# Patient Record
Sex: Female | Born: 1980
Health system: Southern US, Community
[De-identification: ages and names within clinical notes are randomized; demographics above are authoritative.]

## PROBLEM LIST (undated history)

## (undated) DIAGNOSIS — E079 Disorder of thyroid, unspecified: Secondary | ICD-10-CM

## (undated) DIAGNOSIS — E039 Hypothyroidism, unspecified: Secondary | ICD-10-CM

## (undated) DIAGNOSIS — O35BXX0 Maternal care for other (suspected) fetal abnormality and damage, fetal cardiac anomalies, not applicable or unspecified: Secondary | ICD-10-CM

## (undated) DIAGNOSIS — O99280 Endocrine, nutritional and metabolic diseases complicating pregnancy, unspecified trimester: Secondary | ICD-10-CM

## (undated) DIAGNOSIS — D219 Benign neoplasm of connective and other soft tissue, unspecified: Secondary | ICD-10-CM

## (undated) DIAGNOSIS — O358XX0 Maternal care for other (suspected) fetal abnormality and damage, not applicable or unspecified: Secondary | ICD-10-CM

## (undated) DIAGNOSIS — N979 Female infertility, unspecified: Secondary | ICD-10-CM

## (undated) DIAGNOSIS — O09529 Supervision of elderly multigravida, unspecified trimester: Secondary | ICD-10-CM

## (undated) DIAGNOSIS — R87629 Unspecified abnormal cytological findings in specimens from vagina: Secondary | ICD-10-CM

## (undated) DIAGNOSIS — O35EXX0 Maternal care for other (suspected) fetal abnormality and damage, fetal genitourinary anomalies, not applicable or unspecified: Secondary | ICD-10-CM

## (undated) DIAGNOSIS — E041 Nontoxic single thyroid nodule: Secondary | ICD-10-CM

## (undated) HISTORY — DX: Disorder of thyroid, unspecified: E07.9

## (undated) HISTORY — DX: Supervision of elderly multigravida, unspecified trimester: O09.529

## (undated) HISTORY — DX: Maternal care for other (suspected) fetal abnormality and damage, fetal genitourinary anomalies, not applicable or unspecified: O35.EXX0

## (undated) HISTORY — DX: Benign neoplasm of connective and other soft tissue, unspecified: D21.9

## (undated) HISTORY — DX: Endocrine, nutritional and metabolic diseases complicating pregnancy, unspecified trimester: O99.280

## (undated) HISTORY — DX: Nontoxic single thyroid nodule: E04.1

## (undated) HISTORY — DX: Maternal care for other (suspected) fetal abnormality and damage, fetal cardiac anomalies, not applicable or unspecified: O35.BXX0

## (undated) HISTORY — DX: Maternal care for other (suspected) fetal abnormality and damage, not applicable or unspecified: O35.8XX0

## (undated) HISTORY — PX: NO PAST SURGERIES: SHX2092

---

## 2006-09-21 ENCOUNTER — Ambulatory Visit: Payer: Self-pay | Admitting: Internal Medicine

## 2006-09-21 LAB — CONVERTED CEMR LAB
ALT: 18 units/L (ref 0–40)
AST: 18 units/L (ref 0–37)
Albumin: 4 g/dL (ref 3.5–5.2)
Alkaline Phosphatase: 39 units/L (ref 39–117)
BUN: 14 mg/dL (ref 6–23)
Chloride: 107 meq/L (ref 96–112)
Cholesterol: 169 mg/dL (ref 0–200)
Creatinine, Ser: 0.7 mg/dL (ref 0.4–1.2)
Free T4: 0.9 ng/dL (ref 0.9–1.8)
Glucose, Bld: 90 mg/dL (ref 70–99)
HCT: 38.7 % (ref 36.0–46.0)
Platelets: 249 10*3/uL (ref 150–400)
Potassium: 4 meq/L (ref 3.5–5.1)
T3, Free: 3.4 pg/mL (ref 2.3–4.2)
WBC: 9.9 10*3/uL (ref 4.5–10.5)

## 2006-10-01 ENCOUNTER — Encounter: Admission: RE | Admit: 2006-10-01 | Discharge: 2006-10-01 | Payer: Self-pay | Admitting: Internal Medicine

## 2006-12-02 ENCOUNTER — Other Ambulatory Visit: Admission: RE | Admit: 2006-12-02 | Discharge: 2006-12-02 | Payer: Self-pay | Admitting: Surgery

## 2007-03-31 ENCOUNTER — Encounter: Payer: Self-pay | Admitting: Internal Medicine

## 2008-01-11 ENCOUNTER — Telehealth (INDEPENDENT_AMBULATORY_CARE_PROVIDER_SITE_OTHER): Payer: Self-pay | Admitting: *Deleted

## 2008-01-11 ENCOUNTER — Ambulatory Visit: Payer: Self-pay | Admitting: Internal Medicine

## 2008-01-11 LAB — CONVERTED CEMR LAB
Ketones, urine, test strip: NEGATIVE
Specific Gravity, Urine: 1.01

## 2008-01-12 ENCOUNTER — Encounter: Payer: Self-pay | Admitting: Internal Medicine

## 2008-01-12 LAB — CONVERTED CEMR LAB

## 2008-01-13 ENCOUNTER — Encounter: Payer: Self-pay | Admitting: Internal Medicine

## 2008-01-27 ENCOUNTER — Encounter: Payer: Self-pay | Admitting: Internal Medicine

## 2009-01-28 ENCOUNTER — Inpatient Hospital Stay (HOSPITAL_COMMUNITY): Admission: RE | Admit: 2009-01-28 | Discharge: 2009-01-30 | Payer: Self-pay | Admitting: Obstetrics and Gynecology

## 2009-02-19 ENCOUNTER — Ambulatory Visit: Admission: RE | Admit: 2009-02-19 | Discharge: 2009-02-19 | Payer: Self-pay | Admitting: Obstetrics and Gynecology

## 2009-07-19 ENCOUNTER — Ambulatory Visit: Payer: Self-pay | Admitting: Internal Medicine

## 2009-07-19 DIAGNOSIS — E041 Nontoxic single thyroid nodule: Secondary | ICD-10-CM | POA: Insufficient documentation

## 2009-07-31 ENCOUNTER — Ambulatory Visit: Payer: Self-pay | Admitting: Internal Medicine

## 2009-08-02 ENCOUNTER — Encounter (INDEPENDENT_AMBULATORY_CARE_PROVIDER_SITE_OTHER): Payer: Self-pay | Admitting: *Deleted

## 2009-08-02 LAB — CONVERTED CEMR LAB
Basophils Absolute: 0 10*3/uL (ref 0.0–0.1)
CO2: 29 meq/L (ref 19–32)
Chloride: 109 meq/L (ref 96–112)
Cholesterol: 165 mg/dL (ref 0–200)
Glucose, Bld: 80 mg/dL (ref 70–99)
HCT: 40.9 % (ref 36.0–46.0)
HDL: 48 mg/dL (ref >39.00)
LDL Cholesterol: 108 mg/dL — ABNORMAL HIGH (ref 0–99)
Lymphocytes Relative: 45.6 % (ref 12.0–46.0)
MCHC: 34.7 g/dL (ref 30.0–36.0)
MCV: 87.8 fL (ref 78.0–100.0)
Monocytes Relative: 11.2 % (ref 3.0–12.0)
Neutro Abs: 3.1 10*3/uL (ref 1.4–7.7)
Neutrophils Relative %: 40.7 % — ABNORMAL LOW (ref 43.0–77.0)
Platelets: 207 10*3/uL (ref 150.0–400.0)
Potassium: 4 meq/L (ref 3.5–5.1)
RBC: 4.66 M/uL (ref 3.87–5.11)
Sodium: 141 meq/L (ref 135–145)
TSH: 1.58 microintl units/mL (ref 0.35–5.50)

## 2011-02-12 LAB — CBC
Hemoglobin: 11.9 g/dL — ABNORMAL LOW (ref 12.0–15.0)
MCHC: 33.9 g/dL (ref 30.0–36.0)
MCV: 86.1 fL (ref 78.0–100.0)
MCV: 86.1 fL (ref 78.0–100.0)
Platelets: 180 10*3/uL (ref 150–400)
RBC: 3.09 MIL/uL — ABNORMAL LOW (ref 3.87–5.11)
RBC: 4.1 MIL/uL (ref 3.87–5.11)
RDW: 13.9 % (ref 11.5–15.5)
WBC: 10.4 10*3/uL (ref 4.0–10.5)

## 2011-08-07 ENCOUNTER — Encounter: Payer: Self-pay | Admitting: Internal Medicine

## 2011-08-07 ENCOUNTER — Ambulatory Visit (INDEPENDENT_AMBULATORY_CARE_PROVIDER_SITE_OTHER): Payer: BC Managed Care – PPO | Admitting: Internal Medicine

## 2011-08-07 VITALS — BP 98/64 | HR 81 | Resp 14 | Wt 141.0 lb

## 2011-08-07 DIAGNOSIS — R5381 Other malaise: Secondary | ICD-10-CM

## 2011-08-07 DIAGNOSIS — E041 Nontoxic single thyroid nodule: Secondary | ICD-10-CM

## 2011-08-07 DIAGNOSIS — R5383 Other fatigue: Secondary | ICD-10-CM

## 2011-08-07 LAB — CBC WITH DIFFERENTIAL/PLATELET
Eosinophils Relative: 4 % (ref 0–5)
Hemoglobin: 13.3 g/dL (ref 12.0–15.0)
Lymphs Abs: 2.9 10*3/uL (ref 0.7–4.0)
MCHC: 34.7 g/dL (ref 30.0–36.0)
MCV: 86.8 fL (ref 78.0–100.0)
Neutro Abs: 4.4 10*3/uL (ref 1.7–7.7)
Neutrophils Relative %: 52 % (ref 43–77)
Platelets: 248 10*3/uL (ref 150–400)

## 2011-08-07 NOTE — Patient Instructions (Signed)
Call if no better in few weeks

## 2011-08-07 NOTE — Assessment & Plan Note (Addendum)
Used to see surgery, has not been seen in awhile Last u/s ~ 2 years ago Schedule a u/s

## 2011-08-07 NOTE — Assessment & Plan Note (Addendum)
Chief complaint today is fatigue, review of systems is positive for stress. Plan is to get labs, if labs are normal my opinion would be that fatigue is likely related to his stress. With that in mind, we had a long conversation about anxiety, I recommend to see what can be changed in her schedule to decrease stress, I also recommend to see a counselor if needed and of course at some point if she feels is necessary, will prescribe medication. She wonders about vitamins, I recommend a multivitamin, good nutrition, daily exercise (which she does) and drink plenty of fluids.

## 2011-08-07 NOTE — Progress Notes (Signed)
  Subjective:    Patient ID: Vanessa Ferguson, female    DOB: 01/14/1981, 30 y.o.   MRN: [id number]  HPI A few months history of lack of energy, fatigue. At times she feels shaky "like when you drink a lot of coffee". Denies feeling sleepy, is just lack of energy. Has a history of a thyroid nodule, has not seen her surgeon or have the ultrasound in about 3 years Is not taking any birth control pills.  Past Medical History  Diagnosis Date  . Thyroid nodule     R thyroid nodule,used to see Dr Gerrit Friends  , s/pFNA (-) aprox 2008   Past Surgical History  Procedure Date  . No past surgeries    History   Social History  . Marital Status: Married    Spouse Name: N/A    Number of Children: N/A  . Years of Education: N/A   Occupational History  . Not on file.   Social History Main Topics  . Smoking status: Never Smoker   . Smokeless tobacco: Never Used  . Alcohol Use: Yes     socially   . Drug Use: No  . Sexually Active: Not on file   Other Topics Concern  . Not on file   Social History Narrative  . 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 No  depression per se Denies snoring. Periods are normal, has gained 2 or 3 pounds in the last 6 months. Denies any nausea, vomiting, diarrhea.     Objective:   Physical Exam  Constitutional: She is oriented to person, place, and time. She appears well-developed and well-nourished.  HENT:  Head: Normocephalic and atraumatic.  Eyes: Conjunctivae and EOM are normal. Pupils are equal, round, and reactive to light.  Neck:       1.5 cm, nontender right-sided thyroid nodule  Cardiovascular: Normal rate, regular rhythm and normal heart sounds.   No murmur heard. Pulmonary/Chest: Effort normal and breath sounds normal. No respiratory distress. She has no wheezes. She has no rales.    Abdominal: Soft. Bowel sounds are normal.  Neurological: She is alert and oriented to person, place, and time.       Speech, gait, motor are intact. No tremors. DTRs normal  Skin: Skin is warm and dry.  Psychiatric: She has a normal mood and affect. Her behavior is normal. Judgment and thought content normal.          Assessment & Plan:

## 2011-08-08 LAB — TSH: TSH: 0.945 u[IU]/mL (ref 0.350–4.500)

## 2011-08-08 LAB — COMPLETE METABOLIC PANEL WITH GFR
ALT: 11 U/L (ref 0–35)
Albumin: 4.6 g/dL (ref 3.5–5.2)
CO2: 23 mEq/L (ref 19–32)
Calcium: 9.7 mg/dL (ref 8.4–10.5)
Chloride: 103 mEq/L (ref 96–112)
Creat: 0.84 mg/dL (ref 0.50–1.10)
GFR, Est Non African American: >60 mL/min (ref >60)
Potassium: 4 mEq/L (ref 3.5–5.3)
Sodium: 138 mEq/L (ref 135–145)
Total Protein: 7.4 g/dL (ref 6.0–8.3)

## 2011-08-08 LAB — VITAMIN D 25 HYDROXY (VIT D DEFICIENCY, FRACTURES): Vit D, 25-Hydroxy: 35 ng/mL (ref 30–89)

## 2011-08-12 LAB — VITAMIN B12: Vitamin B-12: 607 pg/mL (ref 211–911)

## 2011-08-14 ENCOUNTER — Ambulatory Visit
Admission: RE | Admit: 2011-08-14 | Discharge: 2011-08-14 | Disposition: A | Discharge status: Home / Self Care | Payer: BC Managed Care – PPO | Source: Ambulatory Visit | Attending: Internal Medicine | Admitting: Internal Medicine

## 2011-08-14 DIAGNOSIS — E041 Nontoxic single thyroid nodule: Secondary | ICD-10-CM

## 2011-08-18 ENCOUNTER — Telehealth: Payer: Self-pay | Admitting: *Deleted

## 2011-08-18 DIAGNOSIS — E041 Nontoxic single thyroid nodule: Secondary | ICD-10-CM

## 2011-08-18 NOTE — Telephone Encounter (Signed)
Message copied by Doristine Devoid on Tue Aug 18, 2011  3:44 PM ------      Message from: Vanessa Ferguson      Created: Mon Aug 17, 2011  1:27 PM       Advise patient, thyroid nodule has increased in size, the radiologist is  recommending a biopsy, I agree. Please arrange

## 2011-08-18 NOTE — Telephone Encounter (Signed)
Left msg on voicemail to return call.

## 2011-08-26 NOTE — Telephone Encounter (Signed)
Patient informed; requested Dr. Gerrit Friends and early morning appt. Referral to General Sx placed in Epic.

## 2011-09-16 ENCOUNTER — Encounter (INDEPENDENT_AMBULATORY_CARE_PROVIDER_SITE_OTHER): Payer: Self-pay | Admitting: Surgery

## 2011-09-16 ENCOUNTER — Ambulatory Visit (INDEPENDENT_AMBULATORY_CARE_PROVIDER_SITE_OTHER): Payer: BC Managed Care – PPO | Admitting: Surgery

## 2011-09-16 VITALS — BP 102/78 | HR 80 | Temp 97.8°F | Resp 16 | Wt 139.4 lb

## 2011-09-16 DIAGNOSIS — E041 Nontoxic single thyroid nodule: Secondary | ICD-10-CM

## 2011-09-16 NOTE — Progress Notes (Signed)
Chief Complaint  Patient presents with  . Thyroid Nodule    last seen 2009 - referral by Dr. Willow Ora    HISTORY: Patient is a 30 year old female followed years ago in my practice for right-sided thyroid nodule. At that time she had an approximately 2 cm solitary nodule in the right thyroid lobe. Fine needle aspiration showed nonneoplastic goiter. Patient was treated with thyroid hormone suppression with some decrease in size of the nodule. Patient was lost to follow up in 2009. She discontinued thyroid hormone suppression. She has been followed by her primary care physician.  Patient was recently noted with an enlarging nodule in the thyroid by her primary care physician. She underwent a thyroid ultrasound in October 2012. This shows an enlarged thyroid gland with the right lobe measuring 7.0 cm and left lobe measuring 5.5 cm. The dominant nodule in the right thyroid lobe has increased in size to its current measurement of 3.0 x 1.6 x 2.1 cm. Biopsy is recommended by the radiologist.   Past Medical History  Diagnosis Date  . Thyroid nodule     R thyroid nodule,used to see Dr Gerrit Friends  , s/pFNA (-) aprox 2008     Current Outpatient Prescriptions  Medication Sig Dispense Refill  . Multiple Vitamin (MULTIVITAMIN) tablet Take 1 tablet by mouth daily.        . vitamin B-12 (CYANOCOBALAMIN) 1000 MCG tablet Take 1,000 mcg by mouth daily.           No Known Allergies   Family History  Problem Relation Age of Onset  . Thyroid nodules Mother      History   Social History  . Marital Status: Married    Spouse Name: N/A    Number of Children: N/A  . Years of Education: N/A   Social History Main Topics  . Smoking status: Never Smoker   . Smokeless tobacco: Never Used  . Alcohol Use: Yes     socially   . Drug Use: No  . Sexually Active: None   Other Topics Concern  . None   Social History Narrative  . None     REVIEW OF SYSTEMS - PERTINENT POSITIVES ONLY: Patient denies  compressive symptoms. She denies pain. She denies palpitations. She denies tremors.   EXAM: Filed Vitals:   09/16/11 0955  BP: 102/78  Pulse: 80  Temp: 97.8 F (36.6 C)  Resp: 16    HEENT: normocephalic; pupils equal and reactive; sclerae clear; dentition good; mucous membranes moist NECK:  Palpation reveals a dominant mass in the lower right thyroid lobe measuring at least 3 cm in size. This is firm discrete and mobile. Left lobe shows no dominant or discrete mass; asymmetric on extension; no palpable anterior or posterior cervical lymphadenopathy; no supraclavicular masses; no tenderness CHEST: clear to auscultation bilaterally without rales, rhonchi, or wheezes CARDIAC: regular rate and rhythm without significant murmur; peripheral pulses are full EXT:  non-tender without edema; no deformity NEURO: no gross focal deficits; no sign of tremor   LABORATORY RESULTS: See E-Chart for most recent results   RADIOLOGY RESULTS: See E-Chart or I-Site for most recent results   IMPRESSION: #1 thyroid goiter, asymptomatic #2 dominant right thyroid nodule, 3.0 cm, with interval enlargement   PLAN: The patient and I discussed all the above findings. Her thyroid function is normal with a recent TSH level of 0.945. However the right thyroid nodule has definitely increased in size over the past 4 years. I agree with the recommendation for  fine needle aspiration biopsy. We will arrange for this to be performed at [facility] in the near future. I will contact her with the results of the biopsy.  Certainly if there is atypia or evidence of malignancy the patient will require thyroidectomy. If the biopsy is benign, then there is no absolute indication for a thyroidectomy but she will require continued close followup if she decides against surgical intervention.  Patient will be contacted with the biopsy results. If negative I will ask her to return for followup in 6 months.  Velora Heckler, MD, FACS General & Endocrine Surgery [facility]    Visit Diagnoses: 1. THYROID NODULE     Primary Care Physician: Willow Ora, MD, MD

## 2011-09-23 ENCOUNTER — Ambulatory Visit
Admission: RE | Admit: 2011-09-23 | Discharge: 2011-09-23 | Disposition: A | Discharge status: Home / Self Care | Payer: BC Managed Care – PPO | Source: Ambulatory Visit | Attending: Surgery | Admitting: Surgery

## 2011-09-23 ENCOUNTER — Other Ambulatory Visit (HOSPITAL_COMMUNITY)
Admission: RE | Admit: 2011-09-23 | Discharge: 2011-09-23 | Disposition: A | Discharge status: Home / Self Care | Payer: BC Managed Care – PPO | Source: Ambulatory Visit | Attending: Interventional Radiology | Admitting: Interventional Radiology

## 2011-09-23 DIAGNOSIS — E041 Nontoxic single thyroid nodule: Secondary | ICD-10-CM

## 2011-09-23 DIAGNOSIS — E049 Nontoxic goiter, unspecified: Secondary | ICD-10-CM | POA: Insufficient documentation

## 2011-09-27 NOTE — Progress Notes (Signed)
Quick Note:  Please contact patient with benign path results. TMG ______ 

## 2011-09-29 ENCOUNTER — Other Ambulatory Visit (INDEPENDENT_AMBULATORY_CARE_PROVIDER_SITE_OTHER): Payer: Self-pay | Admitting: Surgery

## 2011-09-29 DIAGNOSIS — E041 Nontoxic single thyroid nodule: Secondary | ICD-10-CM

## 2011-10-06 ENCOUNTER — Telehealth (INDEPENDENT_AMBULATORY_CARE_PROVIDER_SITE_OTHER): Payer: Self-pay | Admitting: Surgery

## 2011-10-06 DIAGNOSIS — E041 Nontoxic single thyroid nodule: Secondary | ICD-10-CM

## 2011-10-06 NOTE — Telephone Encounter (Signed)
Called results of biopsy to patient - benign cytopathology. It is her choice regarding surgery.  No absolute indication for thyroidectomy. I believe this nodule and gland will slowly continue to enlarge and that she will eventually require thyroidectomy. Will schedule follow up ultrasound and office visit in one year. tmg

## 2012-03-25 ENCOUNTER — Other Ambulatory Visit: Payer: BC Managed Care – PPO

## 2012-03-25 ENCOUNTER — Ambulatory Visit
Admission: RE | Admit: 2012-03-25 | Discharge: 2012-03-25 | Disposition: A | Discharge status: Home / Self Care | Payer: BC Managed Care – PPO | Source: Ambulatory Visit | Attending: Surgery | Admitting: Surgery

## 2012-03-25 DIAGNOSIS — E041 Nontoxic single thyroid nodule: Secondary | ICD-10-CM

## 2012-04-06 ENCOUNTER — Telehealth (INDEPENDENT_AMBULATORY_CARE_PROVIDER_SITE_OTHER): Payer: Self-pay

## 2012-04-06 NOTE — Telephone Encounter (Signed)
Pt notified of u/s result and need to make f/u appt in Dec of 2013 per Dr Sid Falcon request.

## 2012-10-20 IMAGING — US US SOFT TISSUE HEAD/NECK
1 series · 14 of 25 positions shown · non-contrast
Comparison: Ultrasound 10/01/2006

CLINICAL DATA: Follow up thyroid nodule

THYROID ULTRASOUND
TECHNIQUE: Ultrasound examination of the thyroid gland and adjacent
soft tissues was performed.

[Series 1: us soft tissue head/neck · 0.08mm/px · 14 of 43 slices shown]
[im 1/43]
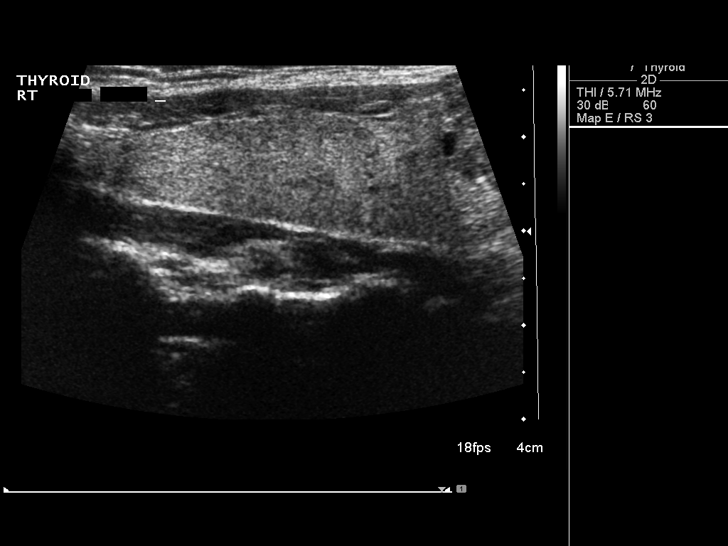
[im 4/43]
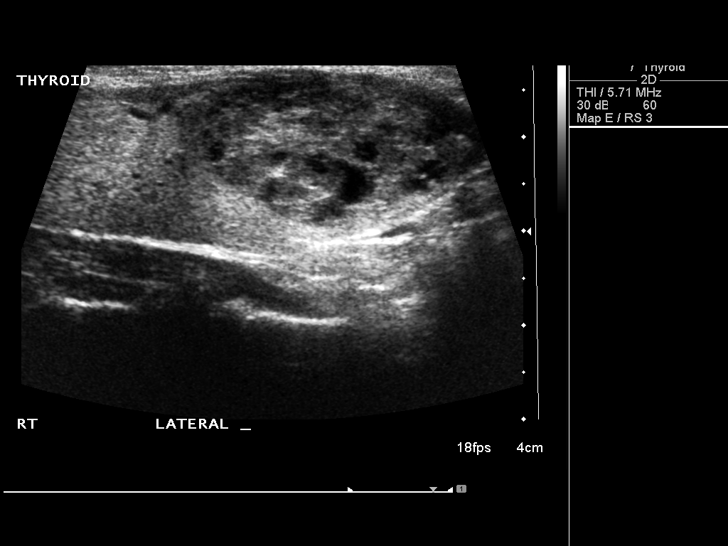
[im 8/43]
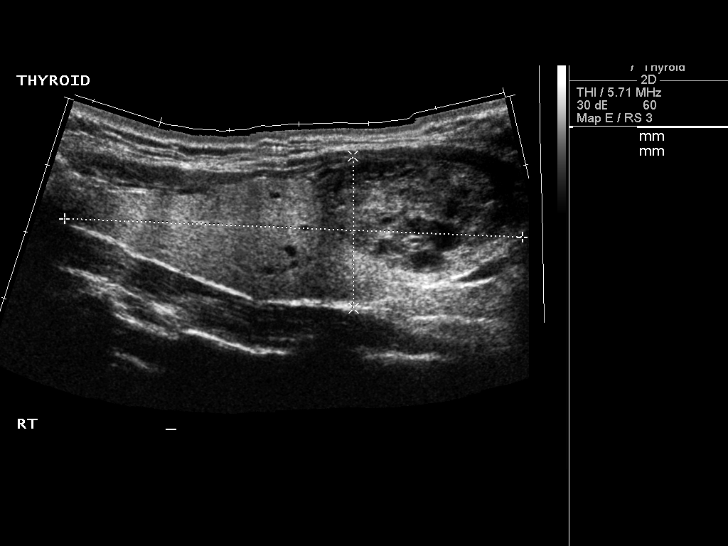
[im 11/43]
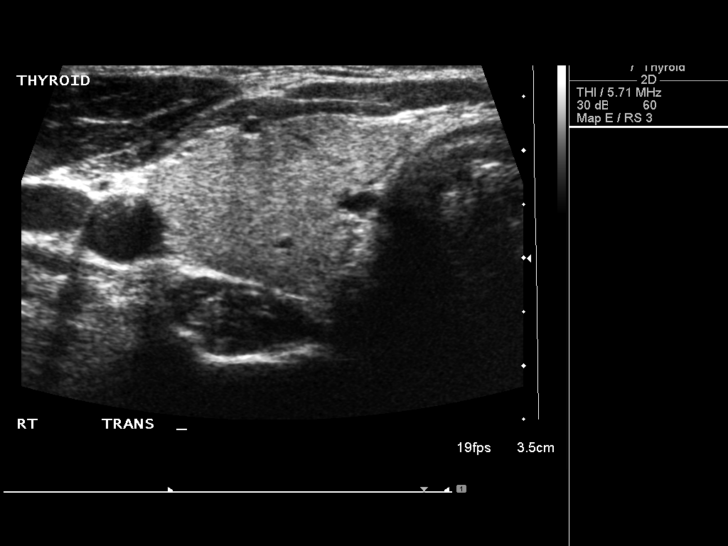
[im 15/43]
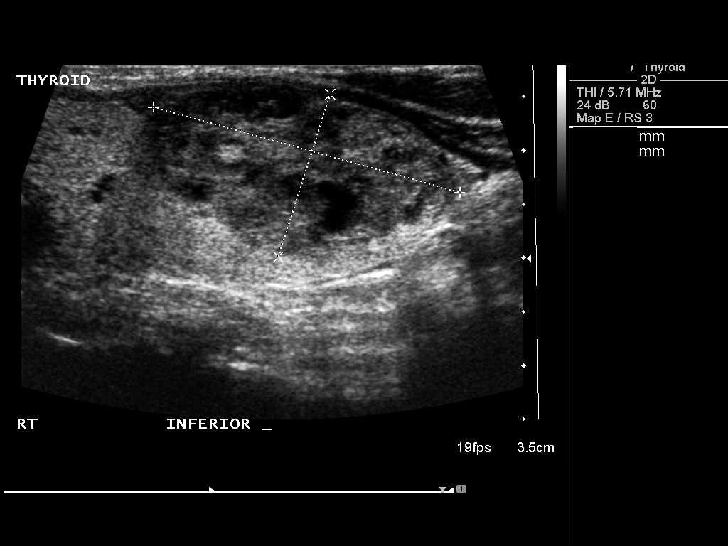
[im 16/43]
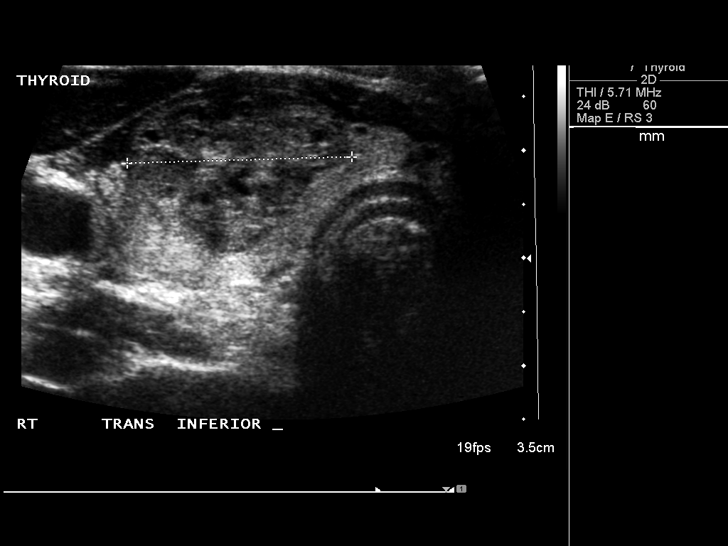
[im 20/43]
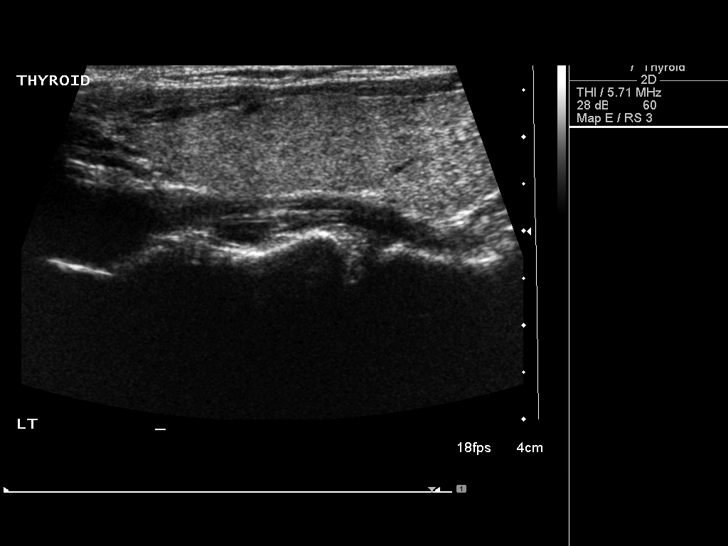
[im 23/43]
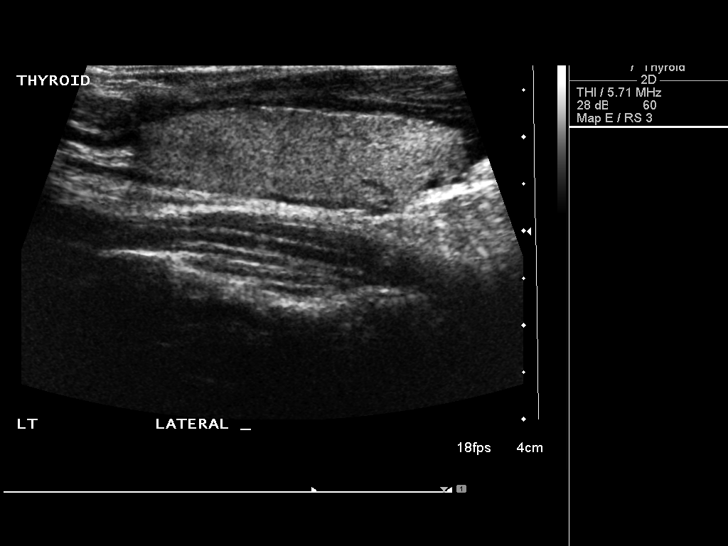
[im 27/43]
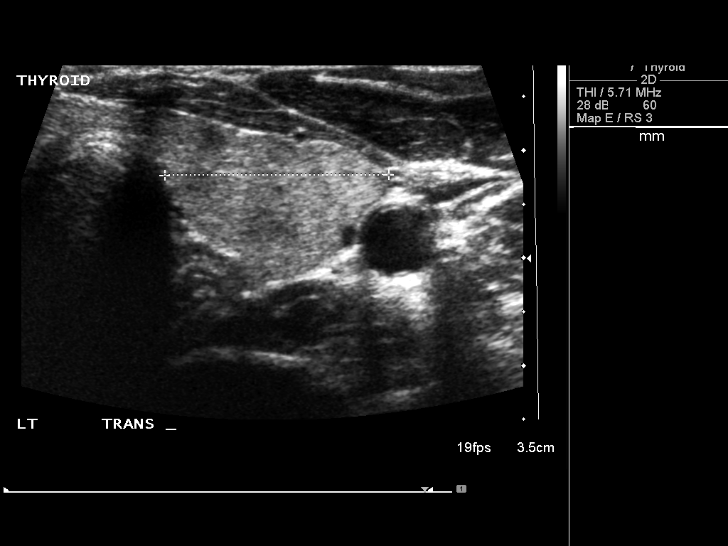
[im 29/43]
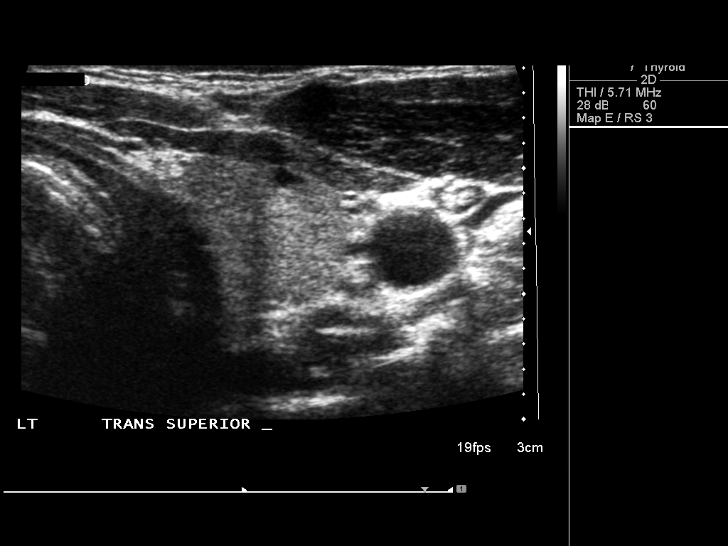
[im 32/43]
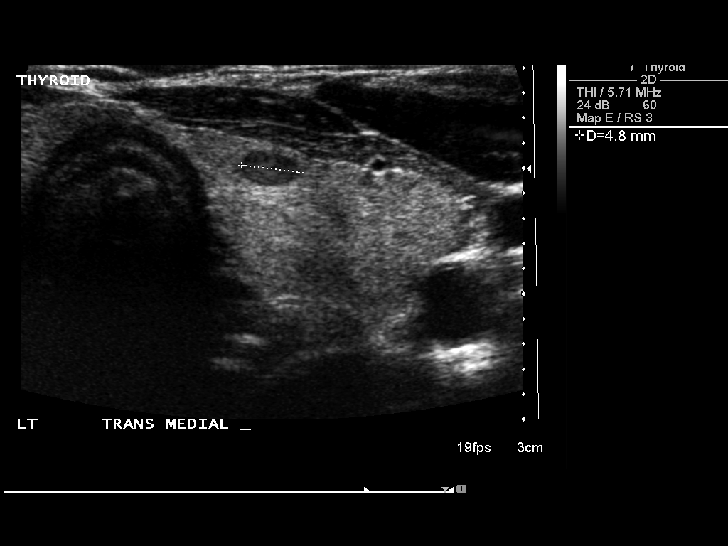
[im 36/43]
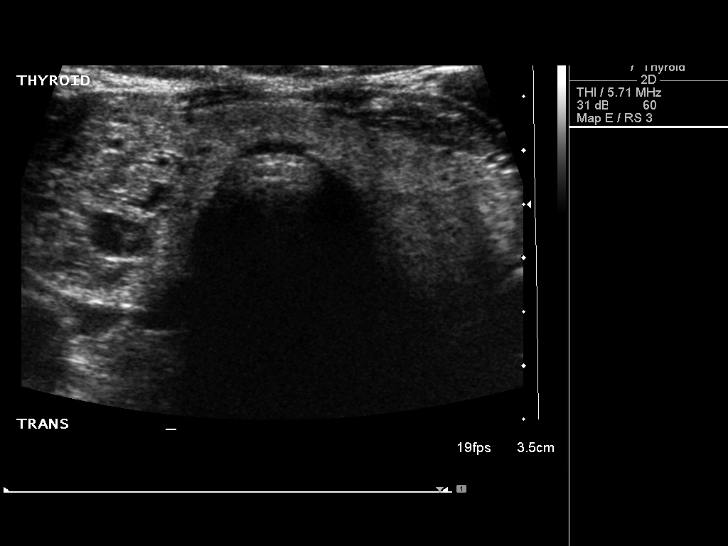
[im 39/43]
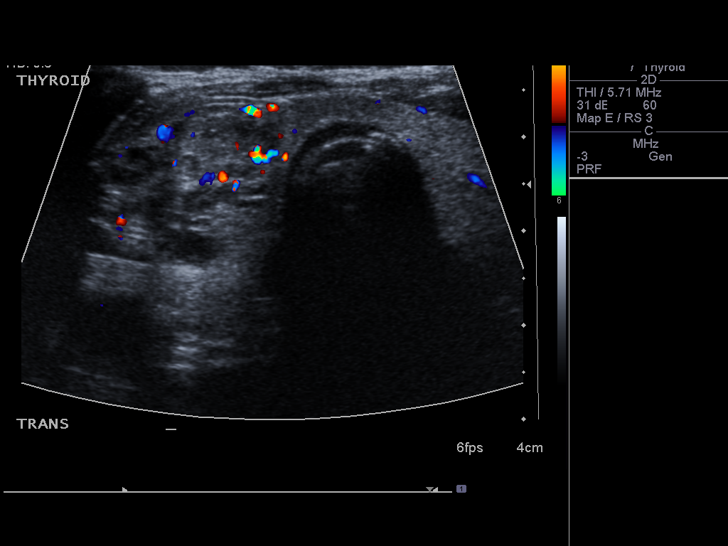
[im 43/43]
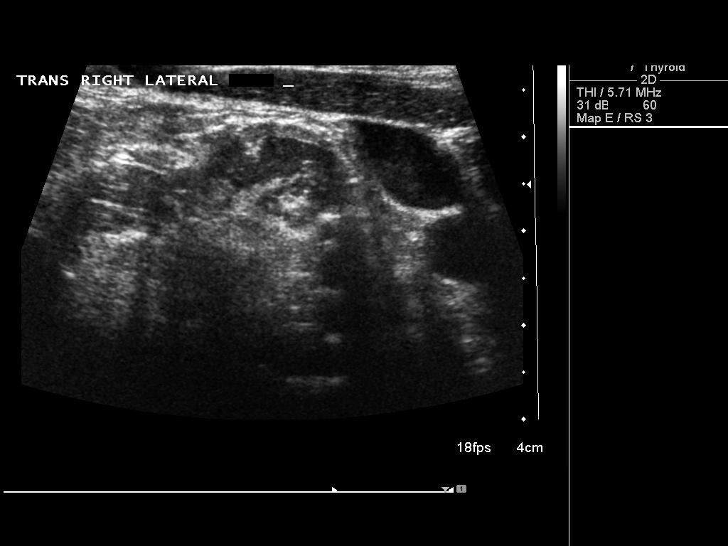

[14 of 25 positions shown; findings below may reference images not displayed]

FINDINGS: Right thyroid lobe:  7.0 x 2.0 x 2.5 cm.
Left thyroid lobe:  5.5 x 1.5 x 2.1 cm
Isthmus:  0.3 cm

Focal nodules:  Right lower pole thyroid nodule is heterogeneous
and primarily solid with multiple small cystic spaces.  This has
enlarged since the prior study now measures 3.0 x 1.6 x 2.1 cm
compared with 2.1 x 1.2 x 0.5 cm on the prior ultrasound.

Small nodule in the left lobe of the thyroid measures 5.4 x 3.0 x
4.8 cm and appears slightly larger.

Lymphadenopathy:  None visualized.
IMPRESSION: Right lower pole nodule has enlarged since 2443.  Biopsy is
warranted.  If this lesion has been biopsied in the past, repeat
biopsy should be considered.

## 2012-11-29 IMAGING — US US THYROID BIOPSY
1 series · 9 of 9 positions shown · non-contrast
Comparison: 08/14/2011

CLINICAL DATA: Enlarging right thyroid nodule.

ULTRASOUND GUIDED NEEDLE ASPIRATE BIOPSY OF THE THYROID GLAND

[Series 1: us thyroid biopsy · 0.06mm/px · 9 acquisitions, 9 frames shown]
[im 1/9]
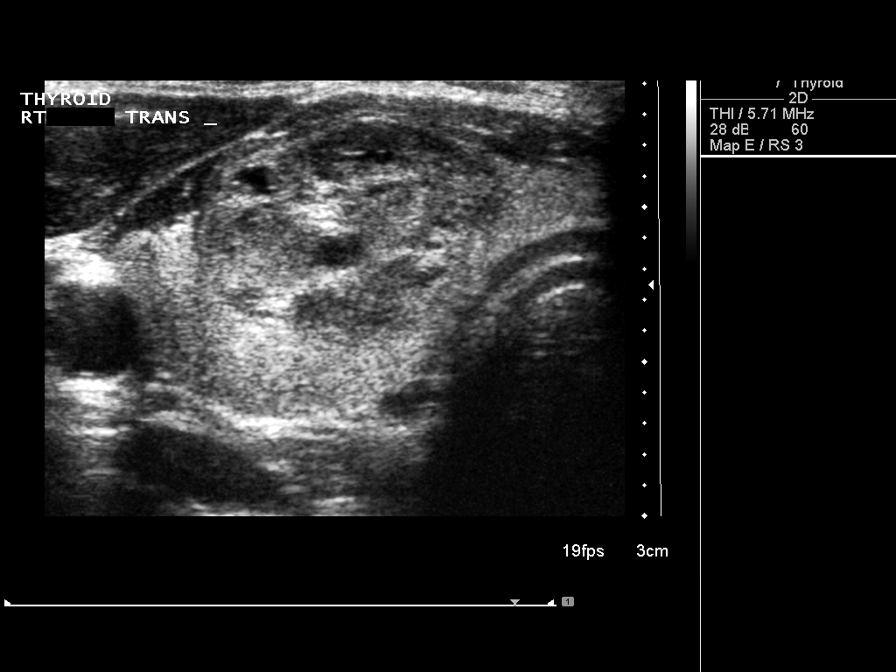
[im 2/9]
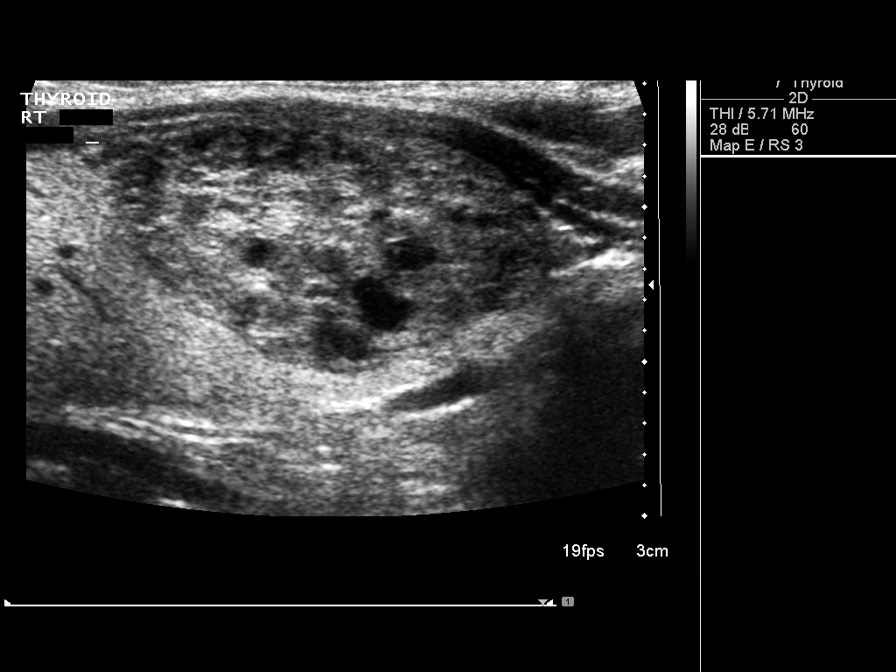
[im 3/9]
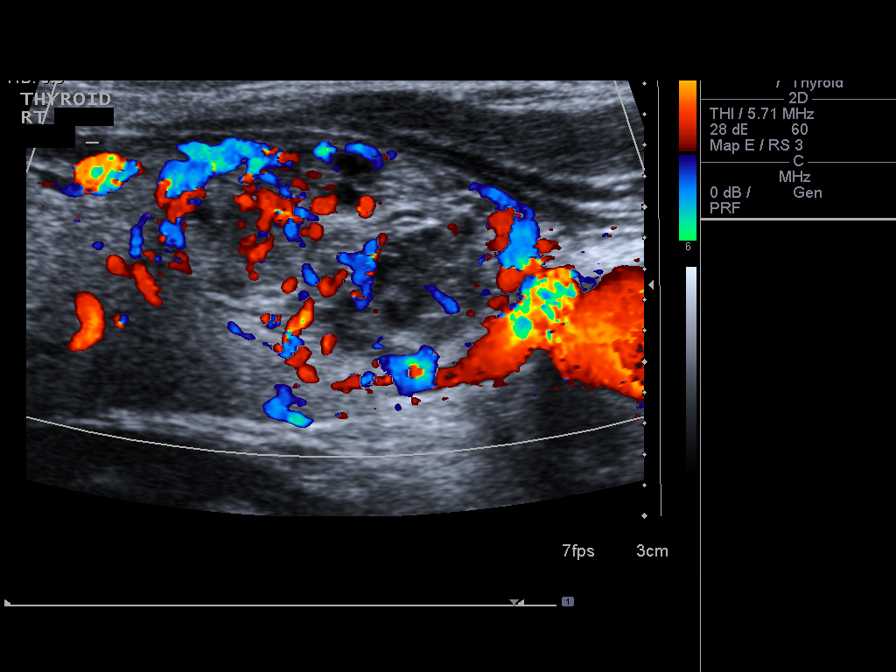
[im 4/9]
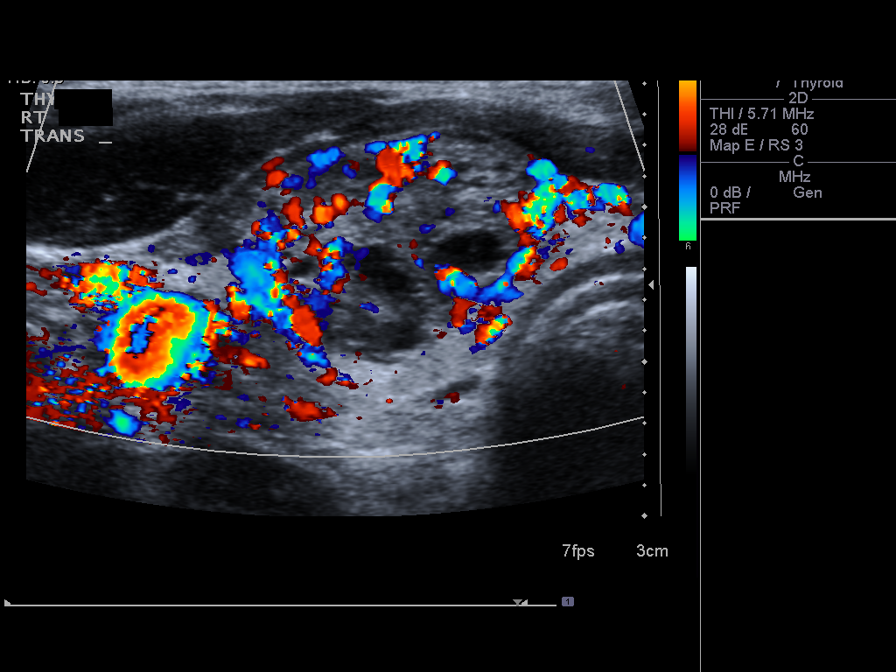
[im 5/9]
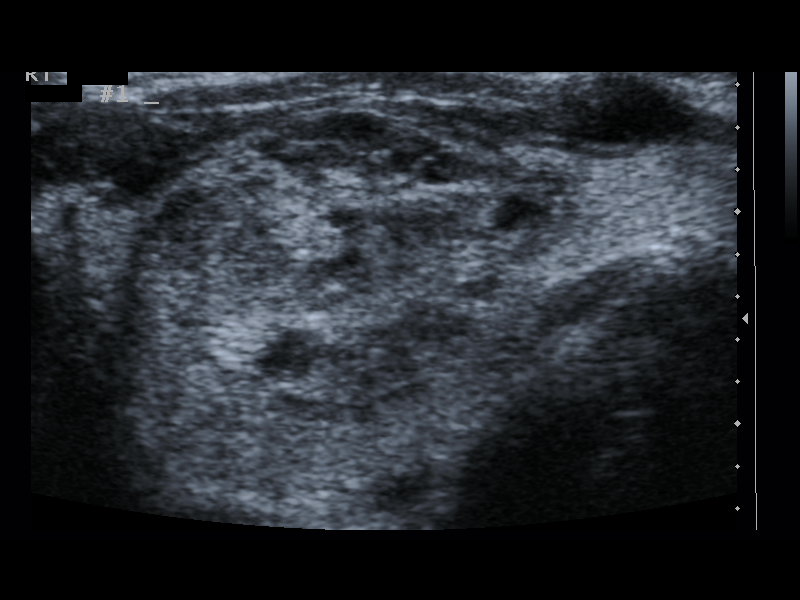
[im 6/9]
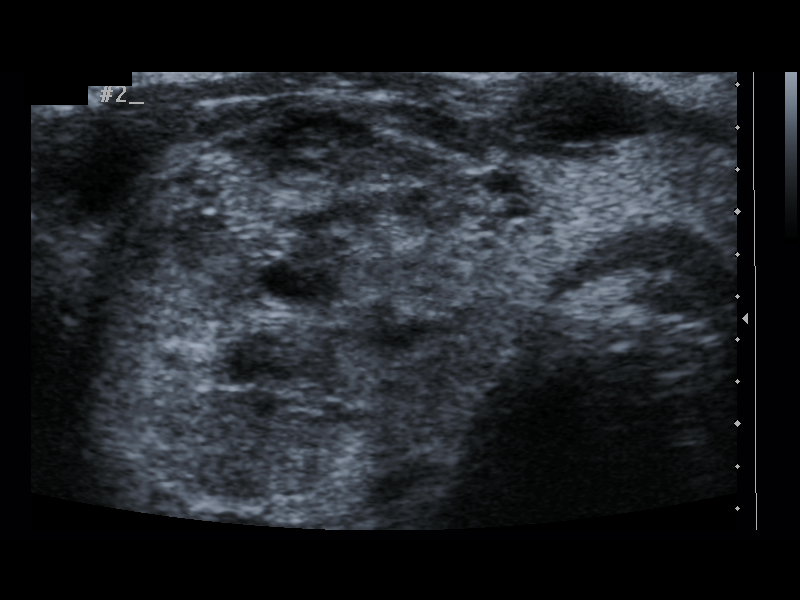
[im 7/9]
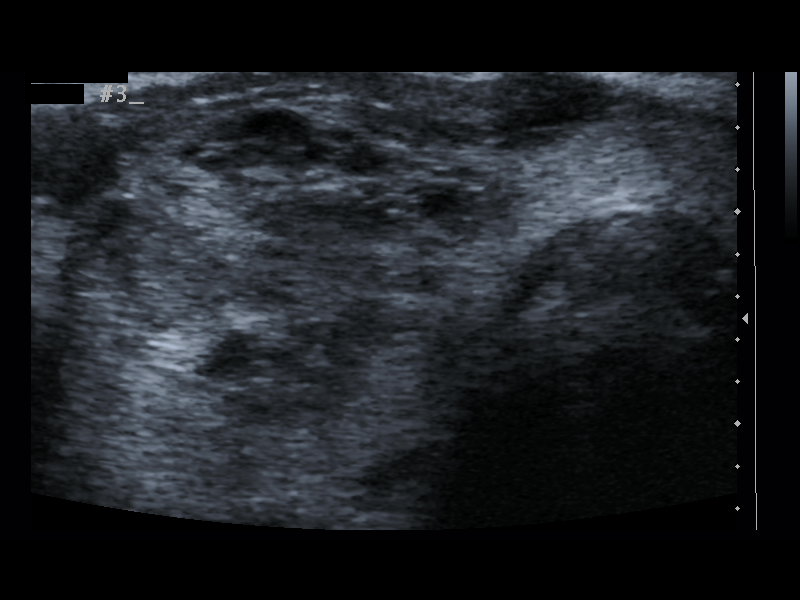
[im 8/9]
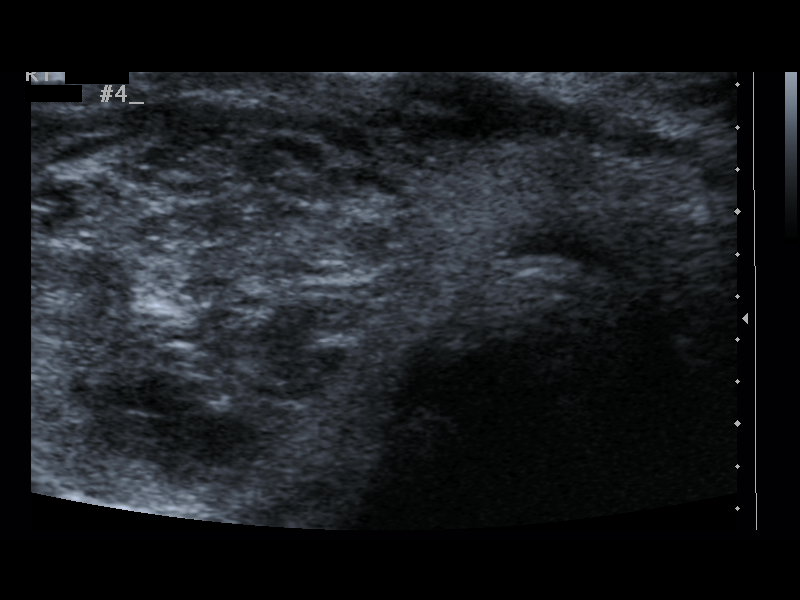
[im 9/9]
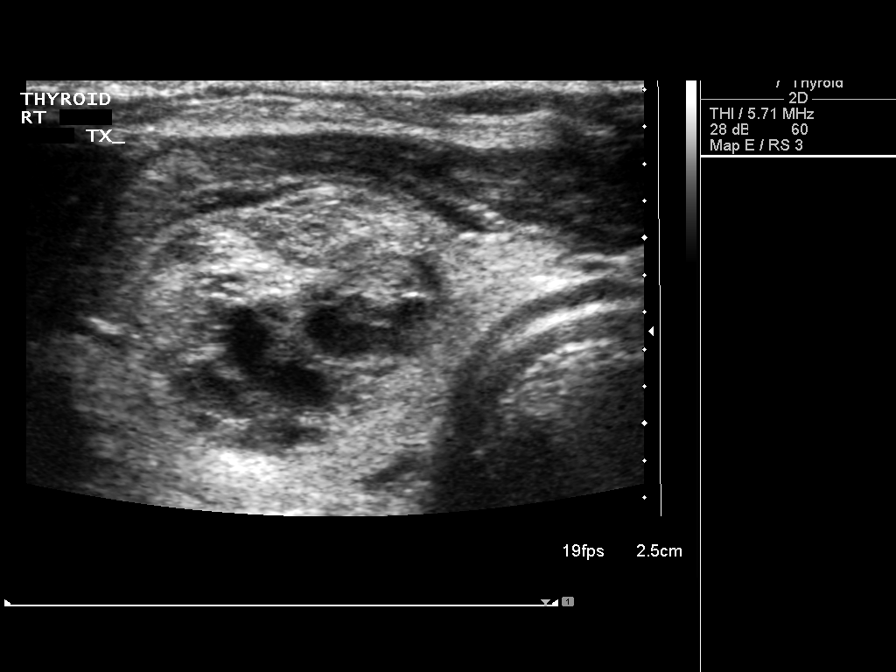

[9 of 9 positions shown; findings below may reference images not displayed]

Thyroid biopsy was thoroughly discussed with the patient and
questions were answered.  The benefits, risks, alternatives, and
complications were also discussed.  The patient understands and
wishes to proceed with the procedure.  Written consent was
obtained.

Ultrasound was performed to localize and mark an adequate site for
the biopsy.  The patient was then prepped and draped in a normal
sterile fashion.  Local anesthesia was provided with 1% lidocaine.
Using direct ultrasound guidance, 4 passes were made using 25 gauge
needles into the nodule within the right lobe of the thyroid.
Ultrasound was used to confirm needle placements on all occasions.
Specimens were sent to Pathology for analysis.

Complications:  None
FINDINGS: Needle aspirate samples were obtained directly in the
dominant right inferior thyroid nodule.
IMPRESSION: Ultrasound guided needle aspirate biopsy performed of the right
thyroid nodule.

## 2013-09-20 ENCOUNTER — Ambulatory Visit (INDEPENDENT_AMBULATORY_CARE_PROVIDER_SITE_OTHER): Payer: Managed Care, Other (non HMO) | Admitting: Internal Medicine

## 2013-09-20 ENCOUNTER — Encounter: Payer: Self-pay | Admitting: Internal Medicine

## 2013-09-20 VITALS — BP 106/70 | HR 74 | Temp 98.6°F | Resp 16 | Wt 132.0 lb

## 2013-09-20 DIAGNOSIS — N390 Urinary tract infection, site not specified: Secondary | ICD-10-CM

## 2013-09-20 DIAGNOSIS — R300 Dysuria: Secondary | ICD-10-CM

## 2013-09-20 LAB — POCT URINALYSIS DIPSTICK
Bilirubin, UA: NEGATIVE
Ketones, UA: NEGATIVE
Nitrite, UA: NEGATIVE
pH, UA: 5

## 2013-09-20 MED ORDER — NITROFURANTOIN MONOHYD MACRO 100 MG PO CAPS: 100.0000 mg | ORAL_CAPSULE | Freq: Two times a day (BID) | ORAL | Status: DC

## 2013-09-20 NOTE — Assessment & Plan Note (Signed)
Sx and UA c/w UTI: see instructions

## 2013-09-20 NOTE — Patient Instructions (Signed)
Take antibiotics as prescribed Drink plenty of fluids Call if you're not better in few days, call if you have high fever, chills, stomach pain.

## 2013-09-20 NOTE — Progress Notes (Signed)
  Subjective:    Patient ID: Vanessa Ferguson, female    DOB: Sep 30, 1981, 32 y.o.   MRN: [id number]  HPI Acute visit Symptoms started 2 days ago with difficulty urinating and dysuria. Last UTI was 2 years ago. Denies any gross hematuria.  Past Medical History  Diagnosis Date  . Thyroid nodule     R thyroid nodule,used to see Dr Gerrit Friends  , s/pFNA (-) aprox 2008   Past Surgical History  Procedure Laterality Date  . No past surgeries     History   Social History  . Marital Status: Married    Spouse Name: N/A    Number of Children: 1  . Years of Education: N/A   Occupational History  . owns a Musician    Social History Main Topics  . Smoking status: Never Smoker   . Smokeless tobacco: Never Used  . Alcohol Use: Yes     Comment: socially   . Drug Use: No  . Sexual Activity: Not on file   Other Topics Concern  . Not on file   Social History Narrative  . No narrative on file     Review of Systems Not on BC, LMP 3 weeks ago Denies fever or chills. No flank or abdominal pain No  nausea or vomiting. No vaginal discharge    Objective:   Physical Exam BP 106/70  Pulse 74  Temp(Src) 98.6 F (37 C) (Oral)  Resp 16  Wt 132 lb (59.875 kg)  SpO2 98%  LMP 09/06/2013 General -- alert, well-developed, NAD.  Abdomen-- Not distended, good bowel sounds,soft, non-tender. No rebound or rigidity. No mass,organomegaly. No CVA tenderness  Extremities-- no pretibial edema bilaterally  Neurologic--  alert & oriented X3. Speech normal, gait normal, strength normal in all extremities.   Psych-- Cognition and judgment appear intact. Cooperative with normal attention span and concentration. No anxious appearing , no depressed appearing.     Assessment & Plan:

## 2013-09-20 NOTE — Progress Notes (Signed)
Pre-visit discussion using our clinic review tool. No additional management support is needed unless otherwise documented below in the visit note.  

## 2013-09-22 LAB — URINE CULTURE

## 2013-10-16 ENCOUNTER — Telehealth: Payer: Self-pay

## 2013-10-16 NOTE — Telephone Encounter (Signed)
Left message for call back  identifiable     

## 2013-10-17 ENCOUNTER — Ambulatory Visit (INDEPENDENT_AMBULATORY_CARE_PROVIDER_SITE_OTHER): Payer: Managed Care, Other (non HMO) | Admitting: Internal Medicine

## 2013-10-17 ENCOUNTER — Encounter: Payer: Self-pay | Admitting: Internal Medicine

## 2013-10-17 VITALS — BP 138/75 | HR 75 | Temp 98.2°F | Ht 65.5 in | Wt 130.0 lb

## 2013-10-17 DIAGNOSIS — E041 Nontoxic single thyroid nodule: Secondary | ICD-10-CM

## 2013-10-17 DIAGNOSIS — Z0000 Encounter for general adult medical examination without abnormal findings: Secondary | ICD-10-CM

## 2013-10-17 DIAGNOSIS — Z23 Encounter for immunization: Secondary | ICD-10-CM

## 2013-10-17 LAB — CBC WITH DIFFERENTIAL/PLATELET
Basophils Relative: 0.5 % (ref 0.0–3.0)
Eosinophils Absolute: 0.1 10*3/uL (ref 0.0–0.7)
Eosinophils Relative: 1.6 % (ref 0.0–5.0)
HCT: 39.8 % (ref 36.0–46.0)
Hemoglobin: 13.5 g/dL (ref 12.0–15.0)
Lymphs Abs: 2.7 10*3/uL (ref 0.7–4.0)
MCHC: 33.9 g/dL (ref 30.0–36.0)
MCV: 87.3 fl (ref 78.0–100.0)
Monocytes Relative: 9.4 % (ref 3.0–12.0)
Neutro Abs: 3.4 10*3/uL (ref 1.4–7.7)
Neutrophils Relative %: 49.2 % (ref 43.0–77.0)
RBC: 4.56 Mil/uL (ref 3.87–5.11)
WBC: 6.8 10*3/uL (ref 4.5–10.5)

## 2013-10-17 LAB — COMPREHENSIVE METABOLIC PANEL
ALT: 14 U/L (ref 0–35)
AST: 14 U/L (ref 0–37)
Albumin: 4.6 g/dL (ref 3.5–5.2)
Alkaline Phosphatase: 34 U/L — ABNORMAL LOW (ref 39–117)
CO2: 25 mEq/L (ref 19–32)
Creatinine, Ser: 0.7 mg/dL (ref 0.4–1.2)
GFR: 100.61 mL/min (ref >60.00)
Glucose, Bld: 86 mg/dL (ref 70–99)
Total Bilirubin: 0.6 mg/dL (ref 0.3–1.2)

## 2013-10-17 LAB — T4, FREE: Free T4: 1 ng/dL (ref 0.60–1.60)

## 2013-10-17 LAB — T3, FREE: T3, Free: 3.2 pg/mL (ref 2.3–4.2)

## 2013-10-17 LAB — LIPID PANEL
Cholesterol: 185 mg/dL (ref 0–200)
LDL Cholesterol: 123 mg/dL — ABNORMAL HIGH (ref 0–99)
Total CHOL/HDL Ratio: 3
VLDL: 8.4 mg/dL (ref 0.0–40.0)

## 2013-10-17 NOTE — Telephone Encounter (Signed)
Unable to reach pre visit.  

## 2013-10-17 NOTE — Assessment & Plan Note (Addendum)
Td today Flu shot today Gyn-- Dr cousins Has a healthy life style , praised! Labs

## 2013-10-17 NOTE — Assessment & Plan Note (Signed)
Status post FNA 09-2011 , neg. Plan:   Ultrasound Addendum, the patient likes to see a nonsurgical specialist, refer to endocrinology.

## 2013-10-17 NOTE — Progress Notes (Signed)
Pre visit review using our clinic review tool, if applicable. No additional management support is needed unless otherwise documented below in the visit note. 

## 2013-10-17 NOTE — Progress Notes (Signed)
   Subjective:    Patient ID: Vanessa Ferguson, female    DOB: 1980-11-17, 32 y.o.   MRN: [id number]  HPI CPX  Past Medical History  Diagnosis Date  . Thyroid nodule     R thyroid nodule,used to see Dr Gerrit Friends  , s/pFNA (-) aprox 2008   Past Surgical History  Procedure Laterality Date  . No past surgeries     History   Social History  . Marital Status: Married    Spouse Name: N/A    Number of Children: 1  . Years of Education: N/A   Occupational History  . owns a Musician    Social History Main Topics  . Smoking status: Never Smoker   . Smokeless tobacco: Never Used  . Alcohol Use: Yes     Comment: socially   . Drug Use: No  . Sexual Activity: Not on file   Other Topics Concern  . Not on file   Social History Narrative   Born in [place], family in [place]   Family History  Problem Relation Age of Onset  . Thyroid nodules Mother   . CAD Neg Hx   . Stroke Neg Hx   . Diabetes Father     dx at age 72  . Colon cancer Neg Hx   . Breast cancer Neg Hx    Review of Systems Diet-- very healthy Exercise-- very active at work, + routine exercise  No  CP, SOB, lower extremity edema  Denies  nausea, vomiting diarrhea Denies  blood in the stools No GERD  Sx.  (-) cough, sputum production UTI sx gone  + stress but handles well       Objective:   Physical Exam BP 138/75  Pulse 75  Temp(Src) 98.2 F (36.8 C)  Ht 5' 5.5" (1.664 m)  Wt 130 lb (58.968 kg)  BMI 21.30 kg/m2  SpO2 100%  LMP 09/06/2013 General -- alert, well-developed, NAD.  Neck --+ R sided ~ 1.5 cm nontender thyroid nodule, no LAD HEENT-- Not pale.  Lungs -- normal respiratory effort, no intercostal retractions, no accessory muscle use, and normal breath sounds.  Heart-- normal rate, regular rhythm, no murmur.  Abdomen-- Not distended, good bowel sounds,soft, non-tender. Palpable not tender aorta  Extremities-- no pretibial edema bilaterally  Neurologic--  alert & oriented X3. Speech normal, gait  normal, strength normal in all extremities.  Psych-- Cognition and judgment appear intact. Cooperative with normal attention span and concentration. No anxious appearing , no depressed appearing.     Assessment & Plan:  Knee pain, reports occasional right knee pain without swelling usually after working on her feet all day. No other joint aches. Recommend a knee sleeve and judicious use of NSAIDs and ice. Will call if symptoms increase

## 2013-10-17 NOTE — Patient Instructions (Signed)
Get your blood work before you leave  Next visit for a physical exam  fasting, in 1 year Please make an appointment

## 2013-10-18 ENCOUNTER — Encounter: Payer: Self-pay | Admitting: *Deleted

## 2013-10-30 ENCOUNTER — Ambulatory Visit: Payer: Managed Care, Other (non HMO) | Admitting: Internal Medicine

## 2013-10-30 DIAGNOSIS — Z0289 Encounter for other administrative examinations: Secondary | ICD-10-CM

## 2014-02-07 ENCOUNTER — Encounter: Payer: Self-pay | Admitting: Internal Medicine

## 2014-02-07 ENCOUNTER — Ambulatory Visit (INDEPENDENT_AMBULATORY_CARE_PROVIDER_SITE_OTHER): Payer: Managed Care, Other (non HMO) | Admitting: Internal Medicine

## 2014-02-07 VITALS — BP 106/71 | HR 75 | Temp 97.5°F | Wt 136.0 lb

## 2014-02-07 DIAGNOSIS — E041 Nontoxic single thyroid nodule: Secondary | ICD-10-CM

## 2014-02-07 DIAGNOSIS — R059 Cough, unspecified: Secondary | ICD-10-CM

## 2014-02-07 DIAGNOSIS — R05 Cough: Secondary | ICD-10-CM

## 2014-02-07 DIAGNOSIS — H6120 Impacted cerumen, unspecified ear: Secondary | ICD-10-CM

## 2014-02-07 MED ORDER — HYDROCODONE-HOMATROPINE 5-1.5 MG/5ML PO SYRP: 5.0000 mL | ORAL_SOLUTION | Freq: Every evening | ORAL | Status: DC | PRN

## 2014-02-07 NOTE — Progress Notes (Signed)
   Subjective:    Patient ID: Vanessa Ferguson, female    DOB: Oct 19, 1981, 33 y.o.   MRN: [id number]  DOS:  02/07/2014 Type of  Visit: Acute visit Symptoms started 3 weeks ago: Persisting cough, unable to sleep, no other symptoms She's not taking any new medications. Not on birth control.   ROS Denies chest pain or difficulty breathing. No wheezing. No fever, had chills one time this week. Denies any sputum production or hemoptysis. No GERD symptoms. + Sneezing on and off and watery eyes. Nose not itching,  Sinuses are congested but there is no actual nasal discharge or postnasal drip No recent prolonged car trips or airplane trips  Past Medical History  Diagnosis Date  . Thyroid nodule     R thyroid nodule,used to see Dr Harlow Asa  , s/pFNA (-) aprox 2008    Past Surgical History  Procedure Laterality Date  . No past surgeries      History   Social History  . Marital Status: Married    Spouse Name: N/A    Number of Children: 1  . Years of Education: N/A   Occupational History  . owns a Chiropractor    Social History Main Topics  . Smoking status: Never Smoker   . Smokeless tobacco: Never Used  . Alcohol Use: Yes     Comment: socially   . Drug Use: No  . Sexual Activity: Not on file   Other Topics Concern  . Not on file   Social History Narrative   Born in [place], family in [place]        Medication List       This list is accurate as of: 02/07/14 11:59 PM.  Always use your most recent med list.               HYDROcodone-homatropine 5-1.5 MG/5ML syrup  Commonly known as:  HYCODAN  Take 5 mLs by mouth at bedtime as needed for cough.     MUCINEX 600 MG 12 hr tablet  Generic drug:  guaiFENesin  Take by mouth 2 (two) times daily.     multivitamin tablet  Take 1 tablet by mouth daily.           Objective:   Physical Exam BP 106/71  Pulse 75  Temp(Src) 97.5 F (36.4 C)  Wt 136 lb (61.689 kg)  SpO2 99%  General -- alert, well-developed, NAD.  Neck  -- thyroid exam unchanged, no LAD   HEENT-- Not pale. TMs R ++ wax, L WNL throat symmetric, no redness or discharge.  Face symmetric, sinuses not tender to palpation. Nose slt congested. Lungs -- normal respiratory effort, no intercostal retractions, no accessory muscle use, and normal breath sounds.  Heart-- normal rate, regular rhythm, no murmur.  Psych-- Cognition and judgment appear intact. Cooperative with normal attention span and concentration. No anxious or depressed appearing.      Assessment & Plan:   Cough, Cough without  associated URI type of symptoms. No GERD. She does have some allergy symptoms. Plan:  Mucinex, Nasacort. Hydrocodone at night temporarily. If not better she will let me now, We'll need to get a chest x-ray further eval  Cerumen impaction-- status post lavage today, Unable to remove all the wax event after the lavage and a trial w/  Forceps. Refer to ENT

## 2014-02-07 NOTE — Progress Notes (Signed)
Pre visit review using our clinic review tool, if applicable. No additional management support is needed unless otherwise documented below in the visit note. 

## 2014-02-07 NOTE — Patient Instructions (Signed)
For cough, take Mucinex DM twice a day as needed  For congestion use OTC Nasocort: 2 nasal sprays on each side of the nose daily until you feel better If the cough is severe at night, try hydrocodone will cause drowsiness (can not take if pregnant) Will refer to ENT Call if no better in few days Call anytime if the symptoms are severe

## 2014-02-08 NOTE — Assessment & Plan Note (Signed)
Thyroid nodule, has not seen endocrinology yet, declined me to order ultrasound.

## 2016-07-20 ENCOUNTER — Encounter: Payer: Self-pay | Admitting: Internal Medicine

## 2016-07-20 ENCOUNTER — Ambulatory Visit (INDEPENDENT_AMBULATORY_CARE_PROVIDER_SITE_OTHER): Payer: BLUE CROSS/BLUE SHIELD | Admitting: Internal Medicine

## 2016-07-20 VITALS — BP 112/76 | HR 76 | Temp 97.9°F | Resp 12 | Ht 65.0 in | Wt 143.4 lb

## 2016-07-20 DIAGNOSIS — Z0000 Encounter for general adult medical examination without abnormal findings: Secondary | ICD-10-CM

## 2016-07-20 DIAGNOSIS — R748 Abnormal levels of other serum enzymes: Secondary | ICD-10-CM | POA: Diagnosis not present

## 2016-07-20 DIAGNOSIS — R7989 Other specified abnormal findings of blood chemistry: Secondary | ICD-10-CM

## 2016-07-20 NOTE — Progress Notes (Signed)
Pre visit review using our clinic review tool, if applicable. No additional management support is needed unless otherwise documented below in the visit note. 

## 2016-07-20 NOTE — Patient Instructions (Signed)
GO TO THE LAB : Get the blood work     GO TO THE FRONT DESK Schedule your next appointment for a  Physical exam in 1 year 

## 2016-07-20 NOTE — Progress Notes (Signed)
Subjective:    Patient ID: Vanessa Ferguson, female    DOB: February 06, 1981, 35 y.o.   MRN: [id number]  DOS:  07/20/2016 Type of visit - description : cpx Interval history:Feeling very well, no concerns    Review of Systems Constitutional: No fever. No chills. No unexplained wt changes. No unusual sweats  HEENT: No dental problems, no ear discharge, no facial swelling, no voice changes. No eye discharge, no eye  redness , no  intolerance to light   Respiratory: No wheezing , no  difficulty breathing. No cough , no mucus production  Cardiovascular: No CP, no leg swelling , no  Palpitations  GI: no nausea, no vomiting, no diarrhea , no  abdominal pain.  No blood in the stools. No dysphagia, no odynophagia    Endocrine: No polyphagia, no polyuria , no polydipsia  GU: No dysuria, gross hematuria, difficulty urinating. No urinary urgency, no frequency.  Musculoskeletal: No joint swellings or unusual aches or pains  Skin: No change in the color of the skin, palor , no  Rash  Allergic, immunologic: No environmental allergies , no  food allergies  Neurological: No dizziness no  syncope. No headaches. No diplopia, no slurred, no slurred speech, no motor deficits, no facial  Numbness  Hematological: No enlarged lymph nodes, no easy bruising , no unusual bleedings  Psychiatry: No suicidal ideas, no hallucinations, no beavior problems, no confusion.  No unusual/severe anxiety, no depression   Past Medical History:  Diagnosis Date  . Thyroid nodule    R thyroid nodule,used to see Dr Harlow Asa  , s/pFNA (-) aprox 2008    Past Surgical History:  Procedure Laterality Date  . NO PAST SURGERIES      Social History   Social History  . Marital status: Married    Spouse name: N/A  . Number of children: 1  . Years of education: N/A   Occupational History  . owns a IT trainer   Social History Main Topics  . Smoking status: Never Smoker  . Smokeless tobacco: Never Used  .  Alcohol use Yes     Comment: socially   . Drug use: No  . Sexual activity: Not on file   Other Topics Concern  . Not on file   Social History Narrative   Born in [place], family in [place]     Family History  Problem Relation Age of Onset  . Thyroid nodules Mother   . Diabetes Father     dx at age 37  . CAD Neg Hx   . Stroke Neg Hx   . Colon cancer Neg Hx   . Breast cancer Neg Hx        Medication List    as of 07/20/2016  3:58 PM   You have not been prescribed any medications.        Objective:   Physical Exam BP 112/76 (BP Location: Left Arm, Patient Position: Sitting, Cuff Size: Small)   Pulse 76   Temp 97.9 F (36.6 C) (Oral)   Resp 12   Ht 5\' 5"  (1.651 m)   Wt 143 lb 6 oz (65 kg)   SpO2 99%   BMI 23.86 kg/m   General:   Well developed, well nourished . NAD.  Neck: Has a right-sided, approximately 2 cm dominant thyroid nodule, no TTP. Otherwise neck exam showed no lymphadenopathies or mass.  HEENT:  Normocephalic . Face symmetric, atraumatic Lungs:  CTA B Normal respiratory effort, no intercostal retractions, no accessory  muscle use. Heart: RRR,  no murmur.  No pretibial edema bilaterally  Abdomen:  Not distended, soft, non-tender. No rebound or rigidity.   Skin: Exposed areas without rash. Not pale. Not jaundice Neurologic:  alert & oriented X3.  Speech normal, gait appropriate for age and unassisted Strength symmetric and appropriate for age.  Psych: Cognition and judgment appear intact.  Cooperative with normal attention span and concentration.  Behavior appropriate. No anxious or depressed appearing.    Assessment & Plan:   Assessment Thyroid nodule, FNA  2008, 2012 negative Birth control- barrier   Plan: H/o thyroid nodules, h/o 2 negative biopsies, on physical exam the thyroid nodule seems to be smaller than before. Self-examination of the patient concurs with my opinion. Last ultrasound 2013. Plan is observation for now RT C  1 year

## 2016-07-20 NOTE — Assessment & Plan Note (Addendum)
Td 2014. Declined a flu shot Female care -- per gyn, plans to be seen by 09-2016  Doing very well with diet, exercise 5 times a week. Labs: CMP, FLP, CBC, TSH, free T3, free T4

## 2016-07-21 DIAGNOSIS — Z09 Encounter for follow-up examination after completed treatment for conditions other than malignant neoplasm: Secondary | ICD-10-CM | POA: Insufficient documentation

## 2016-07-21 LAB — CBC WITH DIFFERENTIAL/PLATELET
Basophils Absolute: 0 10*3/uL (ref 0.0–0.1)
Basophils Relative: 0.6 % (ref 0.0–3.0)
Eosinophils Absolute: 0.1 10*3/uL (ref 0.0–0.7)
Eosinophils Relative: 1.7 % (ref 0.0–5.0)
HCT: 37.4 % (ref 36.0–46.0)
Hemoglobin: 12.9 g/dL (ref 12.0–15.0)
Lymphocytes Relative: 32 % (ref 12.0–46.0)
Lymphs Abs: 2.5 10*3/uL (ref 0.7–4.0)
MCHC: 34.4 g/dL (ref 30.0–36.0)
MCV: 85.8 fl (ref 78.0–100.0)
Monocytes Absolute: 0.9 10*3/uL (ref 0.1–1.0)
Monocytes Relative: 11.2 % (ref 3.0–12.0)
Neutro Abs: 4.2 10*3/uL (ref 1.4–7.7)
Neutrophils Relative %: 54.5 % (ref 43.0–77.0)
Platelets: 211 10*3/uL (ref 150.0–400.0)
RBC: 4.36 Mil/uL (ref 3.87–5.11)
RDW: 12.5 % (ref 11.5–15.5)
WBC: 7.7 10*3/uL (ref 4.0–10.5)

## 2016-07-21 LAB — LIPID PANEL
Cholesterol: 152 mg/dL (ref 0–200)
HDL: 44 mg/dL (ref >39.00)
LDL Cholesterol: 93 mg/dL (ref 0–99)
NonHDL: 108.34
Total CHOL/HDL Ratio: 3
Triglycerides: 78 mg/dL (ref 0.0–149.0)
VLDL: 15.6 mg/dL (ref 0.0–40.0)

## 2016-07-21 LAB — COMPREHENSIVE METABOLIC PANEL
ALT: 22 U/L (ref 0–35)
AST: 18 U/L (ref 0–37)
Albumin: 4.2 g/dL (ref 3.5–5.2)
Alkaline Phosphatase: 33 U/L — ABNORMAL LOW (ref 39–117)
BILIRUBIN TOTAL: 0.3 mg/dL (ref 0.2–1.2)
BUN: 18 mg/dL (ref 6–23)
CALCIUM: 9.2 mg/dL (ref 8.4–10.5)
CO2: 30 meq/L (ref 19–32)
CREATININE: 1.24 mg/dL — AB (ref 0.40–1.20)
Chloride: 103 mEq/L (ref 96–112)
GFR: 52.02 mL/min — ABNORMAL LOW (ref >60.00)
GLUCOSE: 84 mg/dL (ref 70–99)
Potassium: 4.1 mEq/L (ref 3.5–5.1)
Sodium: 138 mEq/L (ref 135–145)
Total Protein: 7.3 g/dL (ref 6.0–8.3)

## 2016-07-21 LAB — TSH: TSH: 0.86 u[IU]/mL (ref 0.35–4.50)

## 2016-07-21 LAB — T4, FREE: FREE T4: 0.86 ng/dL (ref 0.60–1.60)

## 2016-07-21 LAB — T3, FREE: T3, Free: 3.1 pg/mL (ref 2.3–4.2)

## 2016-07-21 NOTE — Assessment & Plan Note (Signed)
H/o thyroid nodules, h/o 2 negative biopsies, on physical exam the thyroid nodule seems to be smaller than before. Self-examination of the patient concurs with my opinion. Last ultrasound 2013. Plan is observation for now RT C  1 year

## 2016-07-22 NOTE — Addendum Note (Signed)
Addended byDamita Dunnings D on: 07/22/2016 11:44 AM   Modules accepted: Orders

## 2016-08-11 ENCOUNTER — Other Ambulatory Visit (INDEPENDENT_AMBULATORY_CARE_PROVIDER_SITE_OTHER): Payer: BLUE CROSS/BLUE SHIELD

## 2016-08-11 DIAGNOSIS — R7989 Other specified abnormal findings of blood chemistry: Secondary | ICD-10-CM | POA: Diagnosis not present

## 2016-08-11 LAB — BASIC METABOLIC PANEL
BUN: 9 mg/dL (ref 6–23)
CHLORIDE: 103 meq/L (ref 96–112)
CO2: 29 meq/L (ref 19–32)
CREATININE: 0.71 mg/dL (ref 0.40–1.20)
Calcium: 9 mg/dL (ref 8.4–10.5)
GFR: 98.96 mL/min (ref >60.00)
Glucose, Bld: 101 mg/dL — ABNORMAL HIGH (ref 70–99)
Potassium: 3.8 mEq/L (ref 3.5–5.1)
Sodium: 137 mEq/L (ref 135–145)

## 2016-08-20 ENCOUNTER — Telehealth: Payer: Self-pay | Admitting: Internal Medicine

## 2016-08-20 NOTE — Telephone Encounter (Signed)
-----   Message from [place], [place] sent at 08/19/2016 10:26 AM EDT ----- Regarding: Patient results Patients husband Eduart Mehmeti, called and stated they received a letter in the mail about her results, however they do not understand what the numbers mean. He asked me to explain him what they mean, I told him that she needed to be the one to call the office in order to get her results over the phone, I could not result them to him, he asked if Dr. Larose Kells can just resend her a letter explaining what the numbers mean instead.  Please advise PC

## 2016-08-20 NOTE — Telephone Encounter (Signed)
Left a message: Labs are okay, kidney function back to normal, I don't believe there is a reason for concern or order more tests. Recommend to call if he has more questions.

## 2017-03-26 DIAGNOSIS — Z6824 Body mass index (BMI) 24.0-24.9, adult: Secondary | ICD-10-CM | POA: Diagnosis not present

## 2017-03-26 DIAGNOSIS — Z1151 Encounter for screening for human papillomavirus (HPV): Secondary | ICD-10-CM | POA: Diagnosis not present

## 2017-03-26 DIAGNOSIS — Z01419 Encounter for gynecological examination (general) (routine) without abnormal findings: Secondary | ICD-10-CM | POA: Diagnosis not present

## 2017-06-28 DIAGNOSIS — Z113 Encounter for screening for infections with a predominantly sexual mode of transmission: Secondary | ICD-10-CM | POA: Diagnosis not present

## 2017-06-28 DIAGNOSIS — E282 Polycystic ovarian syndrome: Secondary | ICD-10-CM | POA: Diagnosis not present

## 2017-06-28 DIAGNOSIS — D252 Subserosal leiomyoma of uterus: Secondary | ICD-10-CM | POA: Diagnosis not present

## 2017-06-28 DIAGNOSIS — Z319 Encounter for procreative management, unspecified: Secondary | ICD-10-CM | POA: Diagnosis not present

## 2017-06-28 DIAGNOSIS — N979 Female infertility, unspecified: Secondary | ICD-10-CM | POA: Diagnosis not present

## 2017-07-09 DIAGNOSIS — Z319 Encounter for procreative management, unspecified: Secondary | ICD-10-CM | POA: Diagnosis not present

## 2017-07-09 DIAGNOSIS — E288 Other ovarian dysfunction: Secondary | ICD-10-CM | POA: Diagnosis not present

## 2017-07-09 DIAGNOSIS — Z3141 Encounter for fertility testing: Secondary | ICD-10-CM | POA: Diagnosis not present

## 2017-07-14 DIAGNOSIS — N858 Other specified noninflammatory disorders of uterus: Secondary | ICD-10-CM | POA: Diagnosis not present

## 2017-08-02 DIAGNOSIS — E288 Other ovarian dysfunction: Secondary | ICD-10-CM | POA: Diagnosis not present

## 2017-08-02 DIAGNOSIS — N979 Female infertility, unspecified: Secondary | ICD-10-CM | POA: Diagnosis not present

## 2017-08-04 DIAGNOSIS — Z319 Encounter for procreative management, unspecified: Secondary | ICD-10-CM | POA: Diagnosis not present

## 2017-08-04 DIAGNOSIS — N979 Female infertility, unspecified: Secondary | ICD-10-CM | POA: Diagnosis not present

## 2017-09-01 DIAGNOSIS — E282 Polycystic ovarian syndrome: Secondary | ICD-10-CM | POA: Diagnosis not present

## 2017-09-01 DIAGNOSIS — N840 Polyp of corpus uteri: Secondary | ICD-10-CM | POA: Diagnosis not present

## 2017-11-02 NOTE — L&D Delivery Note (Addendum)
Delivery Note At 10:45 PM a viable and healthy female was delivered via Vaginal,VAVD due to fetal bradycardia (Presentation:ROA ; vtx ).  APGAR: 8,9 ; weight 8lb 3 oz.   Placenta status:spontaneous intact not sent , .  Cord:  with the following complications: long.  Cord pH: n/a Instrument: mushroom vacuum Anesthesia:  epidural Episiotomy: Median Lacerations: None Suture Repair: 3.0 chromic Est. Blood Loss (mL):    Mom to postpartum.  Baby to Couplet care / Skin to Skin.  Orlin Kann A Chaniyah Jahr 07/20/2018, 11:24 PM

## 2017-11-19 DIAGNOSIS — Z3200 Encounter for pregnancy test, result unknown: Secondary | ICD-10-CM | POA: Diagnosis not present

## 2017-11-23 DIAGNOSIS — Z3201 Encounter for pregnancy test, result positive: Secondary | ICD-10-CM | POA: Diagnosis not present

## 2017-11-23 DIAGNOSIS — Z3200 Encounter for pregnancy test, result unknown: Secondary | ICD-10-CM | POA: Diagnosis not present

## 2017-12-06 DIAGNOSIS — O200 Threatened abortion: Secondary | ICD-10-CM | POA: Diagnosis not present

## 2017-12-20 DIAGNOSIS — O0900 Supervision of pregnancy with history of infertility, unspecified trimester: Secondary | ICD-10-CM | POA: Diagnosis not present

## 2018-01-04 DIAGNOSIS — O26851 Spotting complicating pregnancy, first trimester: Secondary | ICD-10-CM | POA: Diagnosis not present

## 2018-01-04 DIAGNOSIS — Z3A10 10 weeks gestation of pregnancy: Secondary | ICD-10-CM | POA: Diagnosis not present

## 2018-01-04 DIAGNOSIS — Z3689 Encounter for other specified antenatal screening: Secondary | ICD-10-CM | POA: Diagnosis not present

## 2018-01-04 DIAGNOSIS — O09521 Supervision of elderly multigravida, first trimester: Secondary | ICD-10-CM | POA: Diagnosis not present

## 2018-01-04 DIAGNOSIS — O0901 Supervision of pregnancy with history of infertility, first trimester: Secondary | ICD-10-CM | POA: Diagnosis not present

## 2018-01-04 LAB — OB RESULTS CONSOLE HEPATITIS B SURFACE ANTIGEN: HEP B S AG: NEGATIVE

## 2018-01-04 LAB — OB RESULTS CONSOLE HIV ANTIBODY (ROUTINE TESTING): HIV: NONREACTIVE

## 2018-01-04 LAB — OB RESULTS CONSOLE RUBELLA ANTIBODY, IGM: RUBELLA: IMMUNE

## 2018-01-04 LAB — OB RESULTS CONSOLE RPR: RPR: NONREACTIVE

## 2018-01-04 LAB — OB RESULTS CONSOLE ABO/RH: RH Type: POSITIVE

## 2018-01-04 LAB — OB RESULTS CONSOLE ANTIBODY SCREEN: Antibody Screen: NEGATIVE

## 2018-01-06 DIAGNOSIS — Z3A01 Less than 8 weeks gestation of pregnancy: Secondary | ICD-10-CM | POA: Diagnosis not present

## 2018-01-06 DIAGNOSIS — O0901 Supervision of pregnancy with history of infertility, first trimester: Secondary | ICD-10-CM | POA: Diagnosis not present

## 2018-01-06 DIAGNOSIS — O209 Hemorrhage in early pregnancy, unspecified: Secondary | ICD-10-CM | POA: Diagnosis not present

## 2018-01-10 DIAGNOSIS — Z3689 Encounter for other specified antenatal screening: Secondary | ICD-10-CM | POA: Diagnosis not present

## 2018-01-10 DIAGNOSIS — Z3A11 11 weeks gestation of pregnancy: Secondary | ICD-10-CM | POA: Diagnosis not present

## 2018-01-10 DIAGNOSIS — O09521 Supervision of elderly multigravida, first trimester: Secondary | ICD-10-CM | POA: Diagnosis not present

## 2018-01-10 DIAGNOSIS — O209 Hemorrhage in early pregnancy, unspecified: Secondary | ICD-10-CM | POA: Diagnosis not present

## 2018-01-10 DIAGNOSIS — Z118 Encounter for screening for other infectious and parasitic diseases: Secondary | ICD-10-CM | POA: Diagnosis not present

## 2018-01-17 DIAGNOSIS — Z3A12 12 weeks gestation of pregnancy: Secondary | ICD-10-CM | POA: Diagnosis not present

## 2018-01-17 DIAGNOSIS — O0901 Supervision of pregnancy with history of infertility, first trimester: Secondary | ICD-10-CM | POA: Diagnosis not present

## 2018-02-10 DIAGNOSIS — Z3A15 15 weeks gestation of pregnancy: Secondary | ICD-10-CM | POA: Diagnosis not present

## 2018-02-10 DIAGNOSIS — Z361 Encounter for antenatal screening for raised alphafetoprotein level: Secondary | ICD-10-CM | POA: Diagnosis not present

## 2018-02-10 DIAGNOSIS — O0902 Supervision of pregnancy with history of infertility, second trimester: Secondary | ICD-10-CM | POA: Diagnosis not present

## 2018-02-16 DIAGNOSIS — Z3A16 16 weeks gestation of pregnancy: Secondary | ICD-10-CM | POA: Diagnosis not present

## 2018-02-16 DIAGNOSIS — O0902 Supervision of pregnancy with history of infertility, second trimester: Secondary | ICD-10-CM | POA: Diagnosis not present

## 2018-03-03 ENCOUNTER — Other Ambulatory Visit (HOSPITAL_COMMUNITY): Payer: Self-pay | Admitting: Obstetrics and Gynecology

## 2018-03-03 DIAGNOSIS — Z3689 Encounter for other specified antenatal screening: Secondary | ICD-10-CM

## 2018-03-03 DIAGNOSIS — Z3A18 18 weeks gestation of pregnancy: Secondary | ICD-10-CM | POA: Diagnosis not present

## 2018-03-03 DIAGNOSIS — Z363 Encounter for antenatal screening for malformations: Secondary | ICD-10-CM | POA: Diagnosis not present

## 2018-03-03 DIAGNOSIS — O0902 Supervision of pregnancy with history of infertility, second trimester: Secondary | ICD-10-CM | POA: Diagnosis not present

## 2018-03-11 ENCOUNTER — Encounter (HOSPITAL_COMMUNITY): Payer: Self-pay | Admitting: *Deleted

## 2018-03-14 ENCOUNTER — Encounter (HOSPITAL_COMMUNITY): Payer: Self-pay

## 2018-03-15 ENCOUNTER — Ambulatory Visit (HOSPITAL_COMMUNITY)
Admission: RE | Admit: 2018-03-15 | Discharge: 2018-03-15 | Disposition: A | Discharge status: Home / Self Care | Payer: BLUE CROSS/BLUE SHIELD | Source: Ambulatory Visit | Attending: Obstetrics and Gynecology | Admitting: Obstetrics and Gynecology

## 2018-03-15 ENCOUNTER — Encounter (HOSPITAL_COMMUNITY): Payer: Self-pay

## 2018-03-15 ENCOUNTER — Other Ambulatory Visit (HOSPITAL_COMMUNITY): Payer: Self-pay | Admitting: Obstetrics and Gynecology

## 2018-03-15 ENCOUNTER — Other Ambulatory Visit (HOSPITAL_COMMUNITY): Payer: Self-pay | Admitting: *Deleted

## 2018-03-15 DIAGNOSIS — O09812 Supervision of pregnancy resulting from assisted reproductive technology, second trimester: Secondary | ICD-10-CM

## 2018-03-15 DIAGNOSIS — Z3689 Encounter for other specified antenatal screening: Secondary | ICD-10-CM | POA: Diagnosis not present

## 2018-03-15 DIAGNOSIS — Q638 Other specified congenital malformations of kidney: Secondary | ICD-10-CM | POA: Diagnosis not present

## 2018-03-15 DIAGNOSIS — O09522 Supervision of elderly multigravida, second trimester: Secondary | ICD-10-CM

## 2018-03-15 DIAGNOSIS — O359XX0 Maternal care for (suspected) fetal abnormality and damage, unspecified, not applicable or unspecified: Secondary | ICD-10-CM

## 2018-03-15 DIAGNOSIS — Z3A20 20 weeks gestation of pregnancy: Secondary | ICD-10-CM | POA: Insufficient documentation

## 2018-03-15 DIAGNOSIS — D259 Leiomyoma of uterus, unspecified: Secondary | ICD-10-CM | POA: Insufficient documentation

## 2018-03-15 DIAGNOSIS — O3412 Maternal care for benign tumor of corpus uteri, second trimester: Secondary | ICD-10-CM | POA: Insufficient documentation

## 2018-03-15 DIAGNOSIS — O358XX0 Maternal care for other (suspected) fetal abnormality and damage, not applicable or unspecified: Secondary | ICD-10-CM | POA: Diagnosis not present

## 2018-03-15 DIAGNOSIS — O283 Abnormal ultrasonic finding on antenatal screening of mother: Secondary | ICD-10-CM | POA: Diagnosis not present

## 2018-03-15 HISTORY — DX: Female infertility, unspecified: N97.9

## 2018-03-15 HISTORY — DX: Unspecified abnormal cytological findings in specimens from vagina: R87.629

## 2018-03-15 NOTE — Consult Note (Signed)
Maternal Fetal Medicine Consultation  Requesting Provider(s): Cousins  Primary OB: [facility] Reason for consultation: Fetal anomalies noted on office [place]  HPI: 36yo P1011 at 20+3 weeks with multiple cystic structures noted in abdomen on office [place]. This pregnancy was an IVF pregnancy, and due to previous child with autism and AMA she underwent preimplantation testing which was normal. Cell free DNA was also performed and was low risk. She has multiple uterine fibroids which are currently asymptomatic. She reports no other problems with the pregnnacy OB History: OB History    Gravida  3   Para  1   Term  1   Preterm      AB  1   Living  1     SAB  1   TAB      Ectopic      Multiple      Live Births              PMH:  Past Medical History:  Diagnosis Date  . Infertility, female   . Thyroid nodule    R thyroid nodule,used to see Dr Harlow Asa  , s/pFNA (-) aprox 2008  . Vaginal Pap smear, abnormal     PSH:  Past Surgical History:  Procedure Laterality Date  . NO PAST SURGERIES     Meds: PNV Allergies: NKDA FH: See EPIC Soc: See EPIC  Review of Systems: no vaginal bleeding or cramping/contractions, no LOF, no nausea/vomiting. All other systems reviewed and are negative.  PE:  VS: See EPIC GEN: well-appearing female ABD: gravid, NT  Please see separate document for fetal ultrasound report.  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 These findings were discussed at length with the patient and her partner. She will undergo genetic counseling today, but PGT and NIPT results are reassuring that these are probably not syndromic. The patient will need predelivery consultation and that will be set up at her visit. She will require delivery in a facility with pediatric surgery and urology readily available. Please forward any preimplantation testing results you may have from Dr. Kerin Perna to review what  non-aneuploidy tests he may have run  Thank you for the opportunity to be a part of the care of [employer]. Please contact our office if we can be of further assistance.   I spent approximately 30 minutes with this patient with over 50% of time spent in face-to-face counseling.

## 2018-03-15 NOTE — Progress Notes (Signed)
Genetic Counseling  High-Risk Gestation Note  Appointment Date:  03/15/2018 Referred By: Cousins, Sheronette, MD Date of Birth:  10/18/1981 Partner:  Vanessa Ferguson   Pregnancy History: G3P1011 Estimated Date of Delivery: 07/30/18 Estimated Gestational Age: [redacted]w[redacted]d Attending: Mark Newman, MD   I met with [name] and her husband, Mr. Vanessa Ferguson, for genetic counseling because of abnormal ultrasound findings.   In summary:   Discussed ultrasound findings in detail: left duplicated collecting system and enlarged stomach with dilated segment of bowel  Pregnancy conceived with IVF with preimplantation genetic testing for aneuploidy performed  NIPS ([facility]) performed in pregnancy, within normal limits  Reviewed options for additional screening  NIPS for single gene conditions ([facility])- declined  Expanded pan-ethnic carrier screening (including cystic fibrosis carrier screen)- declined additional screening today  Ongoing ultrasound  Reviewed options for diagnostic testing, including risks, benefits, limitations and alternatives  Declined amniocentesis  Reviewed other explanations for ultrasound findings  Reviewed family history concerns  We began by reviewing the ultrasound in detail. Ultrasound today visualized a duplicated collecting system on the left with a dilated upper pole cyst and dilated ureter. Enlarged stomach and dilated segment of bowel is noted, suggestive of GI atresia. Remaining visualized fetal anatomy within normal limits. Complete ultrasound results under separate cover.   In general, duodenal atresia and stenosis are the most common types of intestinal obstruction in the fetus, accounting for approximately 75% of intestinal obstructions. Small bowel atresias can occur anywhere along the small bowel, with the most common sites being jejunal atresia and distal ileal atresia. Polyhydramnios can occur with intestinal atresias, with the risk and  prevalence varying by site of atresia, with more distal atresias associated with lower incidence of polyhydramnios. We discussed that intestinal atresia can be isolated or due to an underlying chromosome or genetic condition.   We spent time reviewing genes, chromosomes, and examples of fetal aneuploidy. We discussed the association with fetal aneuploidy with intestinal atresia, specifically Trisomy 21. We reviewed the previous screening and testing that the couple had, which was within normal limits for fetal aneuploidy. The current pregnancy was conceived via IVF with [facility].  Prior to embryo transfer, this couple elected to have preimplantation genetic testing for aneuploidy per OB records and patient's report, which was within normal limits. We discussed that methodology of PGT-A varies be laboratory but overall the accuracy for aneuploidy is 95-99%.  The patient also underwent noninvasive prenatal screening (NIPS)/prenatal cell free DNA testing in the current pregnancy, which was also reportedly within normal limits. We spent time reviewing the methodology of this screen, the chromosome conditions for which it assesses, and the detection rates of each. We discussed the additional chance for smaller chromosome aberrations, such as microdeletions or microduplications and available testing options in pregnancy. We discussed the diagnostic option of amniocentesis for karyotype and chromosome microarray analysis. The risks, benefits, and limitations of amniocentesis were discussed including the associated 1 in 300-500 risk for complications, including spontaneous pregnancy loss. The couple declined amniocentesis in the pregnancy.   We also discussed the association with single gene conditions with intestinal atresia. We briefly reviewed patterns of inheritance including autosomal recessive, X-linked, and autosomal dominant (including de novo dominant). We discussed that there is a wide  range of single gene conditions that can be associated with structural intestinal and genitourinary tract differences, with the prevalence of each ranging from relatively more common, such as cystic fibrosis, to very rare.   We discussed available screening options for   single gene conditions. We discussed that prenatal screening and testing is not typically available for all single gene conditions associated with intestinal atresias. We discussed that there is a newer NIPS platform ([facility] through [place]) that is able to assess for mutations in a panel of 30 selected genes. The conditions selected for this panel are autosomal dominant or X-linked conditions that typically occur due to de novo gene variations. We reviewed the methodology behind this screen and that the reported detection rate ranges from 43% to greater than 96%, depending upon the specific condition and specific gene. We reviewed possible results including positive, negative, unexpected findings, and no result. We reviewed limitations of the test including that it is not diagnostic, it relies on a high enough fetal fraction in the sample, and that if the patient carriers a mutation in one of the genes on the panel, analysis cannot be performed for the pregnancy. We also discussed that if a mutation is identified in the paternal sample, this will also be reported to the couple. We reviewed the billing process and possible cost of the test.   We discussed the option of carrier screening for a select panel of autosomal recessive and some X-linked conditions, some of which but not all can have intestinal atresia and genitourinary differences as an associated feature. ACOG currently recommends that all patients be offered carrier screening for cystic fibrosis, spinal muscular atrophy and hemoglobinopathies. In addition, they were counseled that there are a variety of genetic screening laboratories that have pan-ethnic, or expanded, carrier screening  panels, which evaluate carrier status for a wide range of genetic conditions. Some of these conditions are severe and actionable, but also rare; others occur more commonly, but are less severe. We discussed that testing options range from screening for a single condition to panels of more than 200 autosomal or X-linked genetic conditions. The prevalence of each condition varies (and often varies with ethnicity). Thus the couples' background risk to be a carrier for each of these various conditions would range, and in some cases be very low or unknown. We reviewed that a negative carrier screen would thus reduce, but not eliminate the chance to be a carrier for these conditions. For some conditions included on specific pan-ethnic carrier screening panels, the phenotype may not yet be well defined. For the majority of conditions on pan-ethnic carrier screening panels, identification of carrier status is not expected to be associated with medical features for the carrier; However, there are currently few exceptions where carrier status has been shown to increase the chance for certain medical concerns. We reviewed that in the event that one partner is found to be a carrier for one or more conditions, carrier screening would be available to the partner for those conditions. We discussed the risks, benefits, and limitations of carrier screening with the couple. Ms. Saulnier stated that a carrier screening panel was likely performed through her REI workup, which likely included cystic fibrosis. However, the couple was unsure at today's visit of the specific screening performed, and records were not available for our review today.   After thoughtful consideration of their options, [name] declined all additional genetic screening and testing at this time, including [facility] and expanded pan-ethnic carrier screening panel. She indicated that she did not want to pursue additional options tests at this time in the  pregnancy but rather elected to proceed with ultrasounds only.  They that ultrasound cannot rule out all birth defects or genetic syndromes.  We discussed  that the prognosis may vary depending on the underlying cause. We discussed the options of prenatal consultation with pediatric surgery and/or pediatric urology later in pregnancy. Follow-up ultrasound is planned for 04/12/18.   Both family histories were reviewed and found to be contributory for the couple's son with autism. They reported that he is currently 38 years old. He had a genetics evaluation and all testing performed at that time was within normal limits. No underlying etiology has been determined for his autism. We discussed that autism is part of the spectrum of conditions referred to as Autistic spectrum disorders (ASD). We discussed that ASDs are among the most common neurodevelopmental disorders, with approximately 1 in 68 children meeting criteria for ASD, according to the Centers for Disease Control. Approximately 80% of individuals diagnosed are female. There is strong evidence that genetic factors play a critical role in development of ASD. There have been recent advances in identifying specific genetic causes of ASD, however, there are still many individuals for whom the etiology of the ASD is not known. The majority of individuals with ASD (70-80%) have essential autism. There is strong evidence that genetic factors play a critical role in development of ASD. Some individuals with ASDs are found to have causative differences in karyotype analysis, chromosomal microarray analysis, or single genes. These are more likely to be identified in individuals with complex autism spectrum disorders.    Once a family has a child with a diagnosis of ASD, there is a 13.5% chance to have another child with ASD. If the pregnancy is female the chance is approximately 9%, and approximately 26% if the pregnancy is female. In the case of an identified genetic  cause, recurrence risk estimate may change, and in some cases could be up to 50%. The patient is aware that for many individuals with autism spectrum disorders an underlying genetic cause is not identified at this time. In the absence of an identified genetic etiology, prenatal screening or testing would not be available in the current pregnancy for the autism spectrum disorders in the family.  Additionally, Mr. Ferguson reported a niece (his brother's daughter) with unilateral retinoblastoma. She is currently 38 years old and was diagnosed at age 1 years old. She is currently in good health. No additional relatives were reported with retinoblastoma, including the affected individual's two siblings. Retinoblastoma (RB) is a malignant tumor of the developing retina, most likely occurring in childhood, typically prior to age 5 years. It can occur unilaterally (approximately 60% of cases) or bilaterally (approximately 40% of cases). Retinoblastoma has been found to occur in cells that have cancer-predisposing gene alterations in both copies of the RB1 gene. A hereditary predisposition is thought to be found in approximately 90% or greater individuals with bilateral RB and approximately 15% of individuals with unilateral RB. Individuals with hereditary RB have a germline mutation of RB1 (gene alteration in one copy of their RB1 gene present from conception and thus theoretically present in all cells). This can either be inherited from a parent or occur for the first time in an individual. Hereditary retinoblastoma appears to follow autosomal dominant inheritance. Genetic testing is available for affected individuals to help determine the likelihood of the presence of a germline mutation in the RB1 gene. Given the reported family history, recurrence risk for the current pregnancy is likely low. However, the exact recurrence risk cannot be determined without additional information regarding the family history. The couple  reported [place] ancestry, and consanguinity for the couple was denied. Without   further information regarding the provided family history, an accurate genetic risk cannot be calculated. Further genetic counseling is warranted if more information is obtained.   Mrs. Jarrard denied exposure to environmental toxins or chemical agents. She denied the use of alcohol, tobacco or street drugs. She denied significant viral illnesses during the course of her pregnancy.   I counseled this couple regarding the above risks and available options.  The approximate face-to-face time with the genetic counselor was 35 minutes.  Karen Corneliussen, MS Certified Genetic Counselor 03/15/2018  

## 2018-03-15 NOTE — Addendum Note (Signed)
Encounter addended by: Novella Rob, RT on: 03/15/2018 10:52 AM  Actions taken: Imaging Exam ended

## 2018-03-16 ENCOUNTER — Encounter (HOSPITAL_COMMUNITY): Payer: Self-pay

## 2018-03-17 DIAGNOSIS — O09812 Supervision of pregnancy resulting from assisted reproductive technology, second trimester: Secondary | ICD-10-CM | POA: Diagnosis not present

## 2018-03-17 DIAGNOSIS — O358XX1 Maternal care for other (suspected) fetal abnormality and damage, fetus 1: Secondary | ICD-10-CM | POA: Diagnosis not present

## 2018-03-17 DIAGNOSIS — O358XX0 Maternal care for other (suspected) fetal abnormality and damage, not applicable or unspecified: Secondary | ICD-10-CM | POA: Diagnosis not present

## 2018-03-18 DIAGNOSIS — Z3A20 20 weeks gestation of pregnancy: Secondary | ICD-10-CM | POA: Insufficient documentation

## 2018-03-30 DIAGNOSIS — O0902 Supervision of pregnancy with history of infertility, second trimester: Secondary | ICD-10-CM | POA: Diagnosis not present

## 2018-03-30 DIAGNOSIS — Z3A22 22 weeks gestation of pregnancy: Secondary | ICD-10-CM | POA: Diagnosis not present

## 2018-04-01 DIAGNOSIS — O358XX1 Maternal care for other (suspected) fetal abnormality and damage, fetus 1: Secondary | ICD-10-CM | POA: Diagnosis not present

## 2018-04-01 DIAGNOSIS — O288 Other abnormal findings on antenatal screening of mother: Secondary | ICD-10-CM | POA: Diagnosis not present

## 2018-04-01 DIAGNOSIS — Z3A22 22 weeks gestation of pregnancy: Secondary | ICD-10-CM | POA: Diagnosis not present

## 2018-04-01 DIAGNOSIS — Z3689 Encounter for other specified antenatal screening: Secondary | ICD-10-CM | POA: Diagnosis not present

## 2018-04-01 DIAGNOSIS — O09522 Supervision of elderly multigravida, second trimester: Secondary | ICD-10-CM | POA: Diagnosis not present

## 2018-04-01 DIAGNOSIS — R9389 Abnormal findings on diagnostic imaging of other specified body structures: Secondary | ICD-10-CM | POA: Diagnosis not present

## 2018-04-01 DIAGNOSIS — O358XX0 Maternal care for other (suspected) fetal abnormality and damage, not applicable or unspecified: Secondary | ICD-10-CM | POA: Diagnosis not present

## 2018-04-12 ENCOUNTER — Ambulatory Visit (HOSPITAL_COMMUNITY): Payer: BLUE CROSS/BLUE SHIELD

## 2018-04-12 ENCOUNTER — Encounter (HOSPITAL_COMMUNITY): Payer: Self-pay

## 2018-04-12 DIAGNOSIS — O09522 Supervision of elderly multigravida, second trimester: Secondary | ICD-10-CM | POA: Diagnosis not present

## 2018-04-12 DIAGNOSIS — Z3689 Encounter for other specified antenatal screening: Secondary | ICD-10-CM | POA: Diagnosis not present

## 2018-04-12 DIAGNOSIS — O09529 Supervision of elderly multigravida, unspecified trimester: Secondary | ICD-10-CM | POA: Diagnosis not present

## 2018-04-12 DIAGNOSIS — Z3A24 24 weeks gestation of pregnancy: Secondary | ICD-10-CM | POA: Diagnosis not present

## 2018-04-12 DIAGNOSIS — O358XX0 Maternal care for other (suspected) fetal abnormality and damage, not applicable or unspecified: Secondary | ICD-10-CM | POA: Diagnosis not present

## 2018-04-21 DIAGNOSIS — Z3A25 25 weeks gestation of pregnancy: Secondary | ICD-10-CM | POA: Diagnosis not present

## 2018-04-21 DIAGNOSIS — O0902 Supervision of pregnancy with history of infertility, second trimester: Secondary | ICD-10-CM | POA: Diagnosis not present

## 2018-04-29 DIAGNOSIS — Z3689 Encounter for other specified antenatal screening: Secondary | ICD-10-CM | POA: Diagnosis not present

## 2018-04-29 DIAGNOSIS — R9389 Abnormal findings on diagnostic imaging of other specified body structures: Secondary | ICD-10-CM | POA: Diagnosis not present

## 2018-04-29 DIAGNOSIS — Z3A26 26 weeks gestation of pregnancy: Secondary | ICD-10-CM | POA: Diagnosis not present

## 2018-04-29 DIAGNOSIS — O283 Abnormal ultrasonic finding on antenatal screening of mother: Secondary | ICD-10-CM | POA: Diagnosis not present

## 2018-05-16 DIAGNOSIS — Z3A29 29 weeks gestation of pregnancy: Secondary | ICD-10-CM | POA: Diagnosis not present

## 2018-05-16 DIAGNOSIS — Z3689 Encounter for other specified antenatal screening: Secondary | ICD-10-CM | POA: Diagnosis not present

## 2018-05-16 DIAGNOSIS — O0903 Supervision of pregnancy with history of infertility, third trimester: Secondary | ICD-10-CM | POA: Diagnosis not present

## 2018-05-26 DIAGNOSIS — O09813 Supervision of pregnancy resulting from assisted reproductive technology, third trimester: Secondary | ICD-10-CM | POA: Diagnosis not present

## 2018-05-26 DIAGNOSIS — O358XX0 Maternal care for other (suspected) fetal abnormality and damage, not applicable or unspecified: Secondary | ICD-10-CM | POA: Diagnosis not present

## 2018-05-26 DIAGNOSIS — Z3689 Encounter for other specified antenatal screening: Secondary | ICD-10-CM | POA: Diagnosis not present

## 2018-05-26 DIAGNOSIS — O283 Abnormal ultrasonic finding on antenatal screening of mother: Secondary | ICD-10-CM | POA: Diagnosis not present

## 2018-05-26 DIAGNOSIS — Z3A30 30 weeks gestation of pregnancy: Secondary | ICD-10-CM | POA: Diagnosis not present

## 2018-05-26 DIAGNOSIS — O358XX5 Maternal care for other (suspected) fetal abnormality and damage, fetus 5: Secondary | ICD-10-CM | POA: Diagnosis not present

## 2018-06-01 DIAGNOSIS — O358XX0 Maternal care for other (suspected) fetal abnormality and damage, not applicable or unspecified: Secondary | ICD-10-CM | POA: Diagnosis not present

## 2018-06-03 DIAGNOSIS — O0903 Supervision of pregnancy with history of infertility, third trimester: Secondary | ICD-10-CM | POA: Diagnosis not present

## 2018-06-03 DIAGNOSIS — Z3A31 31 weeks gestation of pregnancy: Secondary | ICD-10-CM | POA: Diagnosis not present

## 2018-06-15 DIAGNOSIS — O0903 Supervision of pregnancy with history of infertility, third trimester: Secondary | ICD-10-CM | POA: Diagnosis not present

## 2018-06-15 DIAGNOSIS — Z3A33 33 weeks gestation of pregnancy: Secondary | ICD-10-CM | POA: Diagnosis not present

## 2018-06-22 DIAGNOSIS — O358XX0 Maternal care for other (suspected) fetal abnormality and damage, not applicable or unspecified: Secondary | ICD-10-CM | POA: Diagnosis not present

## 2018-06-22 DIAGNOSIS — O09813 Supervision of pregnancy resulting from assisted reproductive technology, third trimester: Secondary | ICD-10-CM | POA: Diagnosis not present

## 2018-06-22 DIAGNOSIS — Z3A34 34 weeks gestation of pregnancy: Secondary | ICD-10-CM | POA: Diagnosis not present

## 2018-06-27 DIAGNOSIS — O0903 Supervision of pregnancy with history of infertility, third trimester: Secondary | ICD-10-CM | POA: Diagnosis not present

## 2018-06-27 DIAGNOSIS — Z3685 Encounter for antenatal screening for Streptococcus B: Secondary | ICD-10-CM | POA: Diagnosis not present

## 2018-06-27 DIAGNOSIS — Z3A35 35 weeks gestation of pregnancy: Secondary | ICD-10-CM | POA: Diagnosis not present

## 2018-06-27 DIAGNOSIS — Z23 Encounter for immunization: Secondary | ICD-10-CM | POA: Diagnosis not present

## 2018-06-28 DIAGNOSIS — Z3A35 35 weeks gestation of pregnancy: Secondary | ICD-10-CM | POA: Diagnosis not present

## 2018-07-08 DIAGNOSIS — Z3A36 36 weeks gestation of pregnancy: Secondary | ICD-10-CM | POA: Diagnosis not present

## 2018-07-11 ENCOUNTER — Other Ambulatory Visit: Payer: Self-pay | Admitting: Obstetrics and Gynecology

## 2018-07-11 ENCOUNTER — Encounter (HOSPITAL_COMMUNITY): Payer: Self-pay | Admitting: *Deleted

## 2018-07-11 ENCOUNTER — Telehealth (HOSPITAL_COMMUNITY): Payer: Self-pay | Admitting: *Deleted

## 2018-07-11 NOTE — Telephone Encounter (Signed)
Preadmission screen  

## 2018-07-14 DIAGNOSIS — Z3A37 37 weeks gestation of pregnancy: Secondary | ICD-10-CM | POA: Diagnosis not present

## 2018-07-20 ENCOUNTER — Inpatient Hospital Stay (HOSPITAL_COMMUNITY): Payer: BLUE CROSS/BLUE SHIELD | Admitting: Anesthesiology

## 2018-07-20 ENCOUNTER — Encounter (HOSPITAL_COMMUNITY): Payer: Self-pay

## 2018-07-20 ENCOUNTER — Inpatient Hospital Stay (HOSPITAL_COMMUNITY)
Admission: AD | Admit: 2018-07-20 | Discharge: 2018-07-22 | DRG: 807 | Disposition: A | Discharge status: Home / Self Care | Payer: BLUE CROSS/BLUE SHIELD | Attending: Obstetrics and Gynecology | Admitting: Obstetrics and Gynecology

## 2018-07-20 DIAGNOSIS — O358XX0 Maternal care for other (suspected) fetal abnormality and damage, not applicable or unspecified: Secondary | ICD-10-CM | POA: Diagnosis not present

## 2018-07-20 DIAGNOSIS — Z23 Encounter for immunization: Secondary | ICD-10-CM | POA: Diagnosis not present

## 2018-07-20 DIAGNOSIS — R1900 Intra-abdominal and pelvic swelling, mass and lump, unspecified site: Secondary | ICD-10-CM | POA: Diagnosis not present

## 2018-07-20 DIAGNOSIS — Z3483 Encounter for supervision of other normal pregnancy, third trimester: Secondary | ICD-10-CM | POA: Diagnosis not present

## 2018-07-20 DIAGNOSIS — Z3A38 38 weeks gestation of pregnancy: Secondary | ICD-10-CM

## 2018-07-20 DIAGNOSIS — Z3490 Encounter for supervision of normal pregnancy, unspecified, unspecified trimester: Secondary | ICD-10-CM | POA: Diagnosis present

## 2018-07-20 LAB — TYPE AND SCREEN
ABO/RH(D): O POS
ANTIBODY SCREEN: NEGATIVE

## 2018-07-20 LAB — CBC
HCT: 36.9 % (ref 36.0–46.0)
Hemoglobin: 12.7 g/dL (ref 12.0–15.0)
MCH: 29.9 pg (ref 26.0–34.0)
MCHC: 34.4 g/dL (ref 30.0–36.0)
MCV: 86.8 fL (ref 78.0–100.0)
Platelets: 228 10*3/uL (ref 150–400)
RBC: 4.25 MIL/uL (ref 3.87–5.11)
RDW: 14 % (ref 11.5–15.5)
WBC: 17.4 10*3/uL — AB (ref 4.0–10.5)

## 2018-07-20 MED ORDER — LIDOCAINE-EPINEPHRINE (PF) 2 %-1:200000 IJ SOLN
INTRAMUSCULAR | Status: DC | PRN
  Administered 2018-07-20: 2 mL via EPIDURAL
  Administered 2018-07-20 (×2): 4 mL via EPIDURAL

## 2018-07-20 MED ORDER — FENTANYL 2.5 MCG/ML BUPIVACAINE 1/10 % EPIDURAL INFUSION (WH - ANES)
14.0000 mL/h | INTRAMUSCULAR | Status: DC | PRN
  Administered 2018-07-20: 14 mL/h via EPIDURAL

## 2018-07-20 MED ORDER — OXYTOCIN 40 UNITS IN LACTATED RINGERS INFUSION - SIMPLE MED
2.5000 [IU]/h | INTRAVENOUS | Status: DC
  Filled 2018-07-20: qty 1000

## 2018-07-20 MED ORDER — TERBUTALINE SULFATE 1 MG/ML IJ SOLN
0.2500 mg | Freq: Once | INTRAMUSCULAR | Status: DC | PRN
  Filled 2018-07-20: qty 1

## 2018-07-20 MED ORDER — OXYTOCIN 40 UNITS IN LACTATED RINGERS INFUSION - SIMPLE MED: 1.0000 m[IU]/min | INTRAVENOUS | Status: DC

## 2018-07-20 MED ORDER — ACETAMINOPHEN 325 MG PO TABS: 650.0000 mg | ORAL_TABLET | ORAL | Status: DC | PRN

## 2018-07-20 MED ORDER — EPHEDRINE 5 MG/ML INJ
10.0000 mg | INTRAVENOUS | Status: DC | PRN
  Filled 2018-07-20: qty 2

## 2018-07-20 MED ORDER — LACTATED RINGERS IV SOLN
500.0000 mL | Freq: Once | INTRAVENOUS | Status: AC
  Administered 2018-07-20: 500 mL via INTRAVENOUS

## 2018-07-20 MED ORDER — LACTATED RINGERS IV SOLN: INTRAVENOUS | Status: DC

## 2018-07-20 MED ORDER — DIPHENHYDRAMINE HCL 50 MG/ML IJ SOLN: 12.5000 mg | INTRAMUSCULAR | Status: DC | PRN

## 2018-07-20 MED ORDER — LIDOCAINE HCL (PF) 1 % IJ SOLN
30.0000 mL | INTRAMUSCULAR | Status: DC | PRN
  Filled 2018-07-20: qty 30

## 2018-07-20 MED ORDER — OXYTOCIN BOLUS FROM INFUSION
500.0000 mL | Freq: Once | INTRAVENOUS | Status: AC
  Administered 2018-07-20: 500 mL via INTRAVENOUS

## 2018-07-20 MED ORDER — LACTATED RINGERS IV SOLN: 500.0000 mL | INTRAVENOUS | Status: DC | PRN

## 2018-07-20 MED ORDER — PHENYLEPHRINE 40 MCG/ML (10ML) SYRINGE FOR IV PUSH (FOR BLOOD PRESSURE SUPPORT)
80.0000 ug | PREFILLED_SYRINGE | INTRAVENOUS | Status: DC | PRN
  Filled 2018-07-20: qty 5

## 2018-07-20 MED ORDER — PHENYLEPHRINE 40 MCG/ML (10ML) SYRINGE FOR IV PUSH (FOR BLOOD PRESSURE SUPPORT)
PREFILLED_SYRINGE | INTRAVENOUS | Status: AC
  Filled 2018-07-20: qty 20

## 2018-07-20 MED ORDER — FENTANYL 2.5 MCG/ML BUPIVACAINE 1/10 % EPIDURAL INFUSION (WH - ANES)
INTRAMUSCULAR | Status: AC
  Filled 2018-07-20: qty 100

## 2018-07-20 MED ORDER — SOD CITRATE-CITRIC ACID 500-334 MG/5ML PO SOLN: 30.0000 mL | ORAL | Status: DC | PRN

## 2018-07-20 MED ORDER — ONDANSETRON HCL 4 MG/2ML IJ SOLN: 4.0000 mg | Freq: Four times a day (QID) | INTRAMUSCULAR | Status: DC | PRN

## 2018-07-20 NOTE — MAU Note (Signed)
Pt presents to MAU with ctx that started today around 8 pm and has had bloody show. Pt denies LOF. +FM

## 2018-07-20 NOTE — Anesthesia Preprocedure Evaluation (Signed)
Anesthesia Evaluation  Patient identified by MRN, date of birth, ID band Patient awake    Reviewed: Allergy & Precautions, NPO status , Patient's Chart, lab work & pertinent test results  Airway Mallampati: I  TM Distance: >3 FB Neck ROM: Full    Dental no notable dental hx. (+) Teeth Intact   Pulmonary neg pulmonary ROS,    Pulmonary exam normal breath sounds clear to auscultation       Cardiovascular negative cardio ROS Normal cardiovascular exam Rhythm:Regular Rate:Normal     Neuro/Psych negative neurological ROS  negative psych ROS   GI/Hepatic negative GI ROS, Neg liver ROS,   Endo/Other  negative endocrine ROS  Renal/GU negative Renal ROS  negative genitourinary   Musculoskeletal negative musculoskeletal ROS (+)   Abdominal   Peds  Hematology negative hematology ROS (+)   Anesthesia Other Findings   Reproductive/Obstetrics (+) Pregnancy AMA                             Anesthesia Physical Anesthesia Plan  ASA: II  Anesthesia Plan: Epidural   Post-op Pain Management:    Induction:   PONV Risk Score and Plan: Treatment may vary due to age or medical condition  Airway Management Planned: Natural Airway  Additional Equipment:   Intra-op Plan:   Post-operative Plan:   Informed Consent: I have reviewed the patients History and Physical, chart, labs and discussed the procedure including the risks, benefits and alternatives for the proposed anesthesia with the patient or authorized representative who has indicated his/her understanding and acceptance.     Plan Discussed with: Anesthesiologist  Anesthesia Plan Comments: (Patient identified. Risks, benefits, options discussed with patient including but not limited to bleeding, infection, nerve damage, paralysis, failed block, incomplete pain control, headache, blood pressure changes, nausea, vomiting, reactions to medication,  itching, and post partum back pain. Confirmed with bedside nurse the patient's most recent platelet count. Confirmed with the patient that they are not taking any anticoagulation, have any bleeding history or any family history of bleeding disorders. Patient expressed understanding and wishes to proceed. All questions were answered. )        Anesthesia Quick Evaluation

## 2018-07-20 NOTE — Anesthesia Procedure Notes (Signed)
Epidural Patient location during procedure: OB Start time: 07/20/2018 9:40 PM End time: 07/20/2018 9:55 PM  Staffing Anesthesiologist: Freddrick March, MD Performed: anesthesiologist   Preanesthetic Checklist Completed: patient identified, pre-op evaluation, timeout performed, IV checked, risks and benefits discussed and monitors and equipment checked  Epidural Patient position: sitting Prep: site prepped and draped and DuraPrep Patient monitoring: continuous pulse ox, blood pressure, heart rate and cardiac monitor Approach: midline Location: L3-L4 Injection technique: LOR air  Needle:  Needle type: Tuohy  Needle gauge: 17 G Needle length: 9 cm Needle insertion depth: 4 cm Catheter type: closed end flexible Catheter size: 19 Gauge Catheter at skin depth: 9 cm Test dose: negative  Assessment Sensory level: T8 Events: blood not aspirated, injection not painful, no injection resistance, negative IV test and no paresthesia  Additional Notes Patient identified. Risks/Benefits/Options discussed with patient including but not limited to bleeding, infection, nerve damage, paralysis, failed block, incomplete pain control, headache, blood pressure changes, nausea, vomiting, reactions to medication both or allergic, itching and postpartum back pain. Confirmed with bedside nurse the patient's most recent platelet count. Confirmed with patient that they are not currently taking any anticoagulation, have any bleeding history or any family history of bleeding disorders. Patient expressed understanding and wished to proceed. All questions were answered. Sterile technique was used throughout the entire procedure. Please see nursing notes for vital signs. Test dose was given through epidural catheter and negative prior to continuing to dose epidural or start infusion. Warning signs of high block given to the patient including shortness of breath, tingling/numbness in hands, complete motor block, or  any concerning symptoms with instructions to call for help. Patient was given instructions on fall risk and not to get out of bed. All questions and concerns addressed with instructions to call with any issues or inadequate analgesia.  Reason for block:procedure for pain

## 2018-07-20 NOTE — H&P (Signed)
Vanessa Ferguson is a 37 y.o. female presenting @ 22 4/[redacted] weeks gestation  In active labor that started at 8pm. (+) vaginal bleeding. Unsure of SROM. (+) FM PNC complicated by anatomic survey at our office finding of "gastric atresia, duplication of ureter. [facility] . Pt elected to go to [facility] for 2nd opinion where she was followed until a month ago. Per report, they felt this was a  cyst compressing on stomach but not involved the stomach OB History    Gravida  3   Para  1   Term  1   Preterm      AB  1   Living  1     SAB  1   TAB      Ectopic      Multiple      Live Births  1          Past Medical History:  Diagnosis Date  . AMA (advanced maternal age) multigravida 37+   . Fetal cardiac anomaly affecting pregnancy, antepartum   . Fetal renal anomaly, single gestation   . Fibroid   . Infertility, female   . Thyroid disease affecting pregnancy   . Thyroid nodule    R thyroid nodule,used to see Dr Harlow Asa  , s/pFNA (-) aprox 2008  . Vaginal Pap smear, abnormal    HPV   Past Surgical History:  Procedure Laterality Date  . NO PAST SURGERIES     Family History: family history includes Cancer in her maternal grandfather; Diabetes in her father; Thyroid nodules in her mother. Social History:  reports that she has never smoked. She has never used smokeless tobacco. She reports that she drank alcohol. She reports that she does not use drugs.     Maternal Diabetes: No Genetic Screening: Normal( nl PGT, nl panorama, nl amnio( @ [facility]) Maternal Ultrasounds/Referrals: Abnormal:  Findings:   Other: Fetal Ultrasounds or other Referrals:  Fetal echo, Referred to Materal Fetal Medicine ( pt initially seen at [facility] and transferred care to [place]. Echo ? [facility]SD Maternal Substance Abuse:  No Significant Maternal Medications:  None Significant Maternal Lab Results:  Lab values include: Group B Strep negative Other Comments:  IVF/ICSI preg, AMA  Review of Systems   All other systems reviewed and are negative.  History Dilation: 9 Station: 0 Exam by:: [name], MD Blood pressure 129/78, pulse (!) 105, temperature 97.8 F (36.6 C), temperature source Oral, resp. rate 20. Maternal Exam:  Uterine Assessment: Contraction strength is mild.  Contraction frequency is regular.   Abdomen: Patient reports no abdominal tenderness. Fetal presentation: vertex  Introitus: Normal vulva.   Physical Exam  Constitutional: She is oriented to person, place, and time. She appears well-developed and well-nourished.  HENT:  Head: Atraumatic.  Eyes: EOM are normal.  Neck: Neck supple.  Cardiovascular: Regular rhythm.  Respiratory: Breath sounds normal.  Musculoskeletal: She exhibits no edema.  Neurological: She is alert and oriented to person, place, and time.  Skin: Skin is warm and dry.  Psychiatric: She has a normal mood and affect.    Prenatal labs: ABO, Rh: O/Positive/-- (03/05 0000) Antibody: Negative (03/05 0000) Rubella: Immune (03/05 0000) RPR: Nonreactive (03/05 0000)  HBsAg: Negative (03/05 0000)  HIV: Non-reactive (03/05 0000)  GBS:   negative  Assessment/Plan: Active labor Term gestation Fetal anomaly P) admit. Routine labs. Epidural. Post delivery fetal eval    Vanessa Ferguson A Vanessa Ferguson 07/20/2018, 9:30 PM

## 2018-07-21 ENCOUNTER — Encounter (HOSPITAL_COMMUNITY): Payer: Self-pay

## 2018-07-21 LAB — CBC
HCT: 31.2 % — ABNORMAL LOW (ref 36.0–46.0)
Hemoglobin: 11 g/dL — ABNORMAL LOW (ref 12.0–15.0)
MCH: 30.3 pg (ref 26.0–34.0)
MCHC: 35.3 g/dL (ref 30.0–36.0)
MCV: 86 fL (ref 78.0–100.0)
PLATELETS: 217 10*3/uL (ref 150–400)
RBC: 3.63 MIL/uL — ABNORMAL LOW (ref 3.87–5.11)
RDW: 13.8 % (ref 11.5–15.5)
WBC: 19.4 10*3/uL — AB (ref 4.0–10.5)

## 2018-07-21 LAB — ABO/RH: ABO/RH(D): O POS

## 2018-07-21 LAB — RPR: RPR: NONREACTIVE

## 2018-07-21 MED ORDER — SIMETHICONE 80 MG PO CHEW: 80.0000 mg | CHEWABLE_TABLET | ORAL | Status: DC | PRN

## 2018-07-21 MED ORDER — FERROUS SULFATE 325 (65 FE) MG PO TABS
325.0000 mg | ORAL_TABLET | Freq: Two times a day (BID) | ORAL | Status: DC
  Administered 2018-07-21: 325 mg via ORAL
  Filled 2018-07-21: qty 1

## 2018-07-21 MED ORDER — ACETAMINOPHEN 325 MG PO TABS: 650.0000 mg | ORAL_TABLET | ORAL | Status: DC | PRN

## 2018-07-21 MED ORDER — IBUPROFEN 600 MG PO TABS
600.0000 mg | ORAL_TABLET | Freq: Four times a day (QID) | ORAL | Status: DC
  Administered 2018-07-21 – 2018-07-22 (×6): 600 mg via ORAL
  Filled 2018-07-21 (×6): qty 1

## 2018-07-21 MED ORDER — COCONUT OIL OIL: 1.0000 "application " | TOPICAL_OIL | Status: DC | PRN

## 2018-07-21 MED ORDER — SALINE SPRAY 0.65 % NA SOLN
1.0000 | NASAL | Status: DC | PRN
  Filled 2018-07-21: qty 44

## 2018-07-21 MED ORDER — DIPHENHYDRAMINE HCL 25 MG PO CAPS: 25.0000 mg | ORAL_CAPSULE | Freq: Four times a day (QID) | ORAL | Status: DC | PRN

## 2018-07-21 MED ORDER — ZOLPIDEM TARTRATE 5 MG PO TABS: 5.0000 mg | ORAL_TABLET | Freq: Every evening | ORAL | Status: DC | PRN

## 2018-07-21 MED ORDER — DIBUCAINE 1 % RE OINT: 1.0000 "application " | TOPICAL_OINTMENT | RECTAL | Status: DC | PRN

## 2018-07-21 MED ORDER — BENZOCAINE-MENTHOL 20-0.5 % EX AERO
1.0000 "application " | INHALATION_SPRAY | CUTANEOUS | Status: DC | PRN
  Administered 2018-07-21: 1 via TOPICAL
  Filled 2018-07-21: qty 56

## 2018-07-21 MED ORDER — SENNOSIDES-DOCUSATE SODIUM 8.6-50 MG PO TABS
2.0000 | ORAL_TABLET | ORAL | Status: DC
  Administered 2018-07-21 – 2018-07-22 (×2): 2 via ORAL
  Filled 2018-07-21 (×2): qty 2

## 2018-07-21 MED ORDER — OXYCODONE HCL 5 MG PO TABS: 5.0000 mg | ORAL_TABLET | ORAL | Status: DC | PRN

## 2018-07-21 MED ORDER — OXYCODONE HCL 5 MG PO TABS: 10.0000 mg | ORAL_TABLET | ORAL | Status: DC | PRN

## 2018-07-21 MED ORDER — WITCH HAZEL-GLYCERIN EX PADS: 1.0000 "application " | MEDICATED_PAD | CUTANEOUS | Status: DC | PRN

## 2018-07-21 MED ORDER — GUAIFENESIN 100 MG/5ML PO SOLN: 5.0000 mL | ORAL | Status: DC | PRN

## 2018-07-21 MED ORDER — PRENATAL MULTIVITAMIN CH
1.0000 | ORAL_TABLET | Freq: Every day | ORAL | Status: DC
  Administered 2018-07-21 – 2018-07-22 (×2): 1 via ORAL
  Filled 2018-07-21 (×2): qty 1

## 2018-07-21 MED ORDER — ONDANSETRON HCL 4 MG/2ML IJ SOLN: 4.0000 mg | INTRAMUSCULAR | Status: DC | PRN

## 2018-07-21 MED ORDER — ONDANSETRON HCL 4 MG PO TABS: 4.0000 mg | ORAL_TABLET | ORAL | Status: DC | PRN

## 2018-07-21 NOTE — Progress Notes (Signed)
PPD 1 SVD with 2nd degree repair  S:  Reports feeling  "ok" - dry cough and congestion since admission              Tolerating po/ No nausea or vomiting             Bleeding is moderate             Pain controlled with motrin             Up ad lib / ambulatory / voiding QS  Female newborn Breastfeeding / abdominal sono pending   O: VS: BP 111/80 (BP Location: Right Arm)   Pulse 83   Temp 97.8 F (36.6 C) (Oral)   Resp 18   Ht 5\' 5"  (1.651 m)   Wt 66.2 kg   SpO2 98%   Breastfeeding? Unknown   BMI 24.30 kg/m   LABS:             Recent Labs    07/20/18 2126 07/21/18 0528  WBC 17.4* 19.4*  HGB 12.7 11.0*  PLT 228 217               Blood type: O positive  Rubella: Immune (03/05 0000)                     I&O: Intake/Output      09/18 0701 - 09/19 0700 09/19 0701 - 09/20 0700   Urine (mL/kg/hr) 300    Blood 425    Total Output 725    Net -725                    Physical Exam:             Alert and oriented X3  Abdomen: soft, non-tender, non-distended              Fundus: firm, non-tender, U-1  Perineum: mild edema - ice pack in place  Lochia: moderate  Extremities: no edema, no calf pain or tenderness   A: PPD # 1 SVD with 2nd degree repair  Doing well - stable status  P: Routine post partum orders   Artelia Laroche CNM, MSN, [facility] 07/21/2018, 2:37 PM

## 2018-07-21 NOTE — Anesthesia Postprocedure Evaluation (Signed)
Anesthesia Post Note  Patient: Vanessa Ferguson  Procedure(s) Performed: AN AD [place]     Patient location during evaluation: Mother Baby Anesthesia Type: Epidural Level of consciousness: awake Pain management: satisfactory to patient Vital Signs Assessment: post-procedure vital signs reviewed and stable Respiratory status: spontaneous breathing Cardiovascular status: stable Anesthetic complications: no    Last Vitals:  Vitals:   07/21/18 0300 07/21/18 0602  BP:  103/71  Pulse:  89  Resp: 18   Temp:  36.8 C  SpO2:      Last Pain:  Vitals:   07/21/18 0847  TempSrc:   PainSc: 0-No pain   Pain Goal:                 Casimer Lanius

## 2018-07-22 MED ORDER — IBUPROFEN 600 MG PO TABS: 600.0000 mg | ORAL_TABLET | Freq: Four times a day (QID) | ORAL | 0 refills | Status: DC

## 2018-07-22 MED ORDER — INFLUENZA VAC SPLIT HIGH-DOSE 0.5 ML IM SUSY
0.5000 mL | PREFILLED_SYRINGE | INTRAMUSCULAR | Status: AC
  Administered 2018-07-22: 0.5 mL via INTRAMUSCULAR
  Filled 2018-07-22: qty 0.5

## 2018-07-22 NOTE — Discharge Summary (Addendum)
Obstetric Discharge Summary Reason for Admission: onset of labor  Prenatal Procedures: ultrasound and MFM consultation for 4cm FETAL abdominal mass, fetal echo Intrapartum Procedures: Vavd, midline episiotomy Postpartum Procedures: none Complications-Operative and Postpartum: none Hemoglobin  Date Value Ref Range Status  07/21/2018 11.0 (L) 12.0 - 15.0 g/dL Final   HCT  Date Value Ref Range Status  07/21/2018 31.2 (L) 36.0 - 46.0 % Final    Physical Exam:  General: alert, cooperative and no distress Lochia: appropriate Uterine Fundus: firm Incision: healing well DVT Evaluation: No evidence of DVT seen on physical exam.  Discharge Diagnoses: Term Pregnancy-delivered  Discharge Information: Date: 07/22/2018 Activity: pelvic rest Diet: routine Medications: PNV and Ibuprofen Condition: stable Instructions: refer to practice specific booklet Discharge to: home Follow-up Information    Servando Salina, MD. Schedule an appointment as soon as possible for a visit in 6 week(s).   Specialty:  Obstetrics and Gynecology Contact information: [address] [place] [address] [phone]           Newborn Data: Live born female  Birth Weight: 8 lb 3.2 oz (3719 g) APGAR: 72, 9  Newborn Delivery   Birth date/time:  07/20/2018 22:45:00 Delivery type:  [place] with mother.  Artelia Laroche 07/22/2018, 9:23 AM

## 2018-07-22 NOTE — Progress Notes (Signed)
PPD 2 SVD with 2nd degree repair  S:  Reports feeling tired             Tolerating po/ No nausea or vomiting             Bleeding is light             Pain controlled with motrin             Up ad lib / ambulatory / voiding QS  Newborn Breast   O:  VS: BP 100/72 (BP Location: Right Arm)   Pulse 81   Temp 97.7 F (36.5 C) (Oral)   Resp 16   Ht 5\' 5"  (1.651 m)   Wt 66.2 kg   SpO2 99%   Breastfeeding? Unknown   BMI 24.30 kg/m               Physical Exam:             Alert and oriented X3  Abdomen: soft, non-tender, non-distended              Fundus: firm, non-tender, Ueven  Perineum: mild edema  Lochia: light  Extremities: trace edema, no calf pain or tenderness   A: PPD # 2 SVD with 2nd degree repair   Doing well - stable status  P: Routine post partum orders  DC home - WOB booklet - instructions reviewed  Artelia Laroche CNM, MSN, [facility] 07/22/2018, 8:39 AM

## 2018-07-23 ENCOUNTER — Inpatient Hospital Stay (HOSPITAL_COMMUNITY): Admission: RE | Admit: 2018-07-23 | Payer: BLUE CROSS/BLUE SHIELD | Source: Ambulatory Visit

## 2018-07-28 ENCOUNTER — Encounter (HOSPITAL_COMMUNITY): Payer: Self-pay | Admitting: Obstetrics and Gynecology

## 2018-08-11 DIAGNOSIS — N816 Rectocele: Secondary | ICD-10-CM | POA: Diagnosis not present

## 2018-08-11 DIAGNOSIS — N8111 Cystocele, midline: Secondary | ICD-10-CM | POA: Diagnosis not present

## 2018-09-08 DIAGNOSIS — Z124 Encounter for screening for malignant neoplasm of cervix: Secondary | ICD-10-CM | POA: Diagnosis not present

## 2018-09-08 DIAGNOSIS — Z1151 Encounter for screening for human papillomavirus (HPV): Secondary | ICD-10-CM | POA: Diagnosis not present

## 2018-09-08 DIAGNOSIS — Z113 Encounter for screening for infections with a predominantly sexual mode of transmission: Secondary | ICD-10-CM | POA: Diagnosis not present

## 2018-09-08 DIAGNOSIS — Z130 Encounter for screening for diseases of the blood and blood-forming organs and certain disorders involving the immune mechanism: Secondary | ICD-10-CM | POA: Diagnosis not present

## 2018-09-08 LAB — CBC AND DIFFERENTIAL
HCT: 39 (ref 36–46)
HEMOGLOBIN: 13 (ref 12.0–16.0)
Platelets: 271 (ref 150–399)
WBC: 7.5

## 2018-09-08 LAB — HM PAP SMEAR

## 2018-09-12 ENCOUNTER — Ambulatory Visit (INDEPENDENT_AMBULATORY_CARE_PROVIDER_SITE_OTHER): Payer: BLUE CROSS/BLUE SHIELD | Admitting: Internal Medicine

## 2018-09-12 ENCOUNTER — Encounter: Payer: Self-pay | Admitting: Internal Medicine

## 2018-09-12 VITALS — BP 120/74 | HR 78 | Temp 97.8°F | Resp 16 | Ht 65.0 in | Wt 137.2 lb

## 2018-09-12 DIAGNOSIS — Z0000 Encounter for general adult medical examination without abnormal findings: Secondary | ICD-10-CM | POA: Diagnosis not present

## 2018-09-12 NOTE — Patient Instructions (Signed)
GO TO THE LAB : Get the blood work     GO TO THE FRONT DESK Schedule your next appointment for a   Physical exam in 1 year

## 2018-09-12 NOTE — Progress Notes (Signed)
Pre visit review using our clinic review tool, if applicable. No additional management support is needed unless otherwise documented below in the visit note. 

## 2018-09-12 NOTE — Progress Notes (Signed)
Subjective:    Patient ID: Vanessa Ferguson, female    DOB: 05-06-1981, 37 y.o.   MRN: [id number]  DOS:  09/12/2018 Type of visit - description : cpx Interval history: Patient is almost 2 months post partum and doing well.   Review of Systems  A 14 point review of systems is negative   Past Medical History:  Diagnosis Date  . AMA (advanced maternal age) multigravida 45+   . Fetal cardiac anomaly affecting pregnancy, antepartum   . Fetal renal anomaly, single gestation   . Fibroid   . Infertility, female   . Thyroid disease affecting pregnancy   . Thyroid nodule    R thyroid nodule,used to see Dr Harlow Asa  , s/pFNA (-) aprox 2008  . Vaginal Pap smear, abnormal    HPV    Past Surgical History:  Procedure Laterality Date  . NO PAST SURGERIES      Social History   Socioeconomic History  . Marital status: Married    Spouse name: Not on file  . Number of children: 2  . Years of education: Not on file  . Highest education level: Not on file  Occupational History  . 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 Financial resource strain: Not hard at all  . Food insecurity:    Worry: Never true    Inability: Never true  . Transportation needs:    Medical: No    Non-medical: No  Tobacco Use  . Smoking status: Never Smoker  . Smokeless tobacco: Never Used  Substance and Sexual Activity  . Alcohol use: Not Currently    Comment: socially   . Drug use: No  . Sexual activity: Yes    Birth control/protection: None  Lifestyle  . Physical activity:    Days per week: Patient refused    Minutes per session: Patient refused  . Stress: Not on file  Relationships  . Social connections:    Talks on phone: Patient refused    Gets together: Patient refused    Attends religious service: Patient refused    Active member of club or organization: Patient refused    Attends meetings of clubs or organizations: Patient refused    Relationship status:  Patient refused  . Intimate partner violence:    Fear of current or ex partner: Patient refused    Emotionally abused: Patient refused    Physically abused: Patient refused    Forced sexual activity: Patient refused  Other Topics Concern  . Not on file  Social History Narrative   Born in [place], family in [place]     Family History  Problem Relation Age of Onset  . Thyroid nodules Mother   . Diabetes Father        dx at age 76  . Cancer Maternal Grandfather   . CAD Neg Hx   . Stroke Neg Hx   . Colon cancer Neg Hx   . Breast cancer Neg Hx      Allergies as of 09/12/2018   No Known Allergies     Medication List        Accurate as of 09/12/18 11:59 PM. Always use your most recent med list.          multivitamin with minerals Tabs tablet Take 1 tablet by mouth daily.          Objective:   Physical Exam BP 120/74 (BP Location: Left Arm, Patient Position: Sitting, Cuff Size: Small)  Pulse 78   Temp 97.8 F (36.6 C) (Oral)   Resp 16   Ht 5\' 5"  (1.651 m)   Wt 137 lb 4 oz (62.3 kg)   SpO2 98%   Breastfeeding? Yes   BMI 22.84 kg/m  General: Well developed, NAD, BMI noted Neck: No  thyromegaly  HEENT:  Normocephalic . Face symmetric, atraumatic Neck: She has a nontender single nodule at the right side of the thyroid.  Approximately 1.5 cm in diameter. Lungs:  CTA B Normal respiratory effort, no intercostal retractions, no accessory muscle use. Heart: RRR,  no murmur.  No pretibial edema bilaterally  Abdomen:  Not distended, soft, non-tender. No rebound or rigidity.   Skin: Exposed areas without rash. Not pale. Not jaundice Neurologic:  alert & oriented X3.  Speech normal, gait appropriate for age and unassisted Strength symmetric and appropriate for age.  Psych: Cognition and judgment appear intact.  Cooperative with normal attention span and concentration.  Behavior appropriate. No anxious or depressed appearing.     Assessment & Plan:    Assessment Thyroid nodule, FNA  2008, 2012 negative History of infertility, first child born ~ 2010, 2 child: 9/202019 (IVF)  PLAN: Here for CPX Thyroid nodule: Stable on clinical grounds, consider a [place] next year Almost 2 months postpartum, already had a follow-up with gynecology.  Breast-feeding.  She seems to be doing well, her mother is visiting and provides great help ; no evidence of postpartum depression, knows to reach out for help if that ever happens. RTC 1 year

## 2018-09-12 NOTE — Assessment & Plan Note (Addendum)
Td 2014 and  2019 pe pt; flu shot 07/2018 Female care -- per gyn  CCS: never had a cscope  Diet and exercise Labs: Just had her iron checked by gynecology, she is not fasting. will check a CMP, FLP and TSH.

## 2018-09-13 LAB — COMPREHENSIVE METABOLIC PANEL
ALBUMIN: 4.4 g/dL (ref 3.5–5.2)
ALK PHOS: 51 U/L (ref 39–117)
ALT: 51 U/L — AB (ref 0–35)
AST: 22 U/L (ref 0–37)
BILIRUBIN TOTAL: 0.5 mg/dL (ref 0.2–1.2)
BUN: 16 mg/dL (ref 6–23)
CALCIUM: 9.5 mg/dL (ref 8.4–10.5)
CO2: 29 mEq/L (ref 19–32)
CREATININE: 0.69 mg/dL (ref 0.40–1.20)
Chloride: 104 mEq/L (ref 96–112)
GFR: 101.12 mL/min (ref >60.00)
Glucose, Bld: 76 mg/dL (ref 70–99)
Potassium: 4.2 mEq/L (ref 3.5–5.1)
Sodium: 140 mEq/L (ref 135–145)
TOTAL PROTEIN: 6.9 g/dL (ref 6.0–8.3)

## 2018-09-13 LAB — LIPID PANEL
Cholesterol: 210 mg/dL — ABNORMAL HIGH (ref 0–200)
HDL: 57.7 mg/dL (ref >39.00)
LDL Cholesterol: 131 mg/dL — ABNORMAL HIGH (ref 0–99)
NonHDL: 151.95
TRIGLYCERIDES: 105 mg/dL (ref 0.0–149.0)
Total CHOL/HDL Ratio: 4
VLDL: 21 mg/dL (ref 0.0–40.0)

## 2018-09-13 LAB — TSH: TSH: 0.93 u[IU]/mL (ref 0.35–4.50)

## 2018-09-14 ENCOUNTER — Encounter: Payer: Self-pay | Admitting: Internal Medicine

## 2019-09-12 DIAGNOSIS — Z6824 Body mass index (BMI) 24.0-24.9, adult: Secondary | ICD-10-CM | POA: Diagnosis not present

## 2019-09-12 DIAGNOSIS — Z1151 Encounter for screening for human papillomavirus (HPV): Secondary | ICD-10-CM | POA: Diagnosis not present

## 2019-09-12 DIAGNOSIS — Z01419 Encounter for gynecological examination (general) (routine) without abnormal findings: Secondary | ICD-10-CM | POA: Diagnosis not present

## 2019-09-14 ENCOUNTER — Other Ambulatory Visit: Payer: Self-pay

## 2019-09-15 ENCOUNTER — Ambulatory Visit (INDEPENDENT_AMBULATORY_CARE_PROVIDER_SITE_OTHER): Payer: BC Managed Care – PPO | Admitting: Internal Medicine

## 2019-09-15 ENCOUNTER — Other Ambulatory Visit: Payer: Self-pay

## 2019-09-15 ENCOUNTER — Encounter: Payer: Self-pay | Admitting: Internal Medicine

## 2019-09-15 VITALS — BP 116/69 | HR 92 | Temp 97.9°F | Resp 12 | Ht 65.0 in | Wt 142.8 lb

## 2019-09-15 DIAGNOSIS — E041 Nontoxic single thyroid nodule: Secondary | ICD-10-CM | POA: Diagnosis not present

## 2019-09-15 DIAGNOSIS — Z8742 Personal history of other diseases of the female genital tract: Secondary | ICD-10-CM | POA: Diagnosis not present

## 2019-09-15 DIAGNOSIS — Z23 Encounter for immunization: Secondary | ICD-10-CM

## 2019-09-15 DIAGNOSIS — Z0000 Encounter for general adult medical examination without abnormal findings: Secondary | ICD-10-CM

## 2019-09-15 NOTE — Progress Notes (Signed)
Subjective:    Patient ID: Vanessa Ferguson, female    DOB: 1981/03/18, 38 y.o.   MRN: [id number]  DOS:  09/15/2019 Type of visit - description: CPX No concerns She remains active, taking long walks every day. Reports healthy diet with plenty of fruits and vegetables, would like her vitamins checked  Review of Systems  Other than above, a 14 point review of systems is negative    Past Medical History:  Diagnosis Date  . AMA (advanced maternal age) multigravida 68+   . Fetal cardiac anomaly affecting pregnancy, antepartum   . Fetal renal anomaly, single gestation   . Fibroid   . Infertility, female   . Thyroid disease affecting pregnancy   . Thyroid nodule    R thyroid nodule,used to see Dr Harlow Asa  , s/pFNA (-) aprox 2008  . Vaginal Pap smear, abnormal    HPV    Past Surgical History:  Procedure Laterality Date  . NO PAST SURGERIES      Social History   Socioeconomic History  . Marital status: Married    Spouse name: Not on file  . Number of children: 2  . Years of education: Not on file  . Highest education level: Not on file  Occupational History  . 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 Financial resource strain: Not hard at all  . Food insecurity    Worry: Never true    Inability: Never true  . Transportation needs    Medical: No    Non-medical: No  Tobacco Use  . Smoking status: Never Smoker  . Smokeless tobacco: Never Used  Substance and Sexual Activity  . Alcohol use: Not Currently    Comment:    . Drug use: No  . Sexual activity: Yes    Birth control/protection: None  Lifestyle  . Physical activity    Days per week: Patient refused    Minutes per session: Patient refused  . Stress: Not on file  Relationships  . Social Herbalist on phone: Patient refused    Gets together: Patient refused    Attends religious service: Patient refused    Active member of club or organization: Patient refused   Attends meetings of clubs or organizations: Patient refused    Relationship status: Patient refused  . Intimate partner violence    Fear of current or ex partner: Patient refused    Emotionally abused: Patient refused    Physically abused: Patient refused    Forced sexual activity: Patient refused  Other Topics Concern  . Not on file  Social History Narrative   Born in [place], parents and brother  moved to [place] 2020     Family History  Problem Relation Age of Onset  . Thyroid nodules Mother   . Diabetes Father        dx at age 102  . Cancer Maternal Grandfather   . CAD Neg Hx   . Stroke Neg Hx   . Colon cancer Neg Hx   . Breast cancer Neg Hx      Allergies as of 09/15/2019   No Known Allergies     Medication List       Accurate as of September 15, 2019 11:59 PM. If you have any questions, ask your nurse or doctor.        STOP taking these medications   multivitamin with minerals Tabs tablet Stopped by: Kathlene November, MD  Objective:   Physical Exam BP 116/69 (BP Location: Right Arm, Cuff Size: Normal)   Pulse 92   Temp 97.9 F (36.6 C) (Temporal)   Resp 12   Ht 5\' 5"  (1.651 m)   Wt 142 lb 12.8 oz (64.8 kg)   LMP 08/27/2019   SpO2 99%   BMI 23.76 kg/m  General: Well developed, NAD, BMI noted Neck: + Thyromegaly, right-sided, not tender.  Does not seem to be much larger than previous years HEENT:  Normocephalic . Face symmetric, atraumatic Lungs:  CTA B Normal respiratory effort, no intercostal retractions, no accessory muscle use. Heart: RRR,  no murmur.  No pretibial edema bilaterally  Abdomen:  Not distended, soft, non-tender. No rebound or rigidity.   Skin: Exposed areas without rash. Not pale. Not jaundice Neurologic:  alert & oriented X3.  Speech normal, gait appropriate for age and unassisted Strength symmetric and appropriate for age.  Psych: Cognition and judgment appear intact.  Cooperative with normal attention span and  concentration.  Behavior appropriate. No anxious or depressed appearing.     Assessment     Assessment Thyroid nodule, FNA  2008, 2012 negative History of infertility, first child born ~ 2010, 2 child: 9/202019 (IVF)  PLAN: Here for CPX Thyroid nodule: Seems a stable, will check ultrasound. RTC 1 year

## 2019-09-15 NOTE — Patient Instructions (Signed)
GO TO THE LAB : Get the blood work     GO TO THE FRONT DESK Schedule your next appointment   for a physical exam in 1 year 

## 2019-09-16 LAB — CBC WITH DIFFERENTIAL/PLATELET
Absolute Monocytes: 966 cells/uL — ABNORMAL HIGH (ref 200–950)
Basophils Absolute: 46 cells/uL (ref 0–200)
Basophils Relative: 0.4 %
Eosinophils Absolute: 127 cells/uL (ref 15–500)
Eosinophils Relative: 1.1 %
HCT: 39.1 % (ref 35.0–45.0)
Hemoglobin: 13.3 g/dL (ref 11.7–15.5)
Lymphs Abs: 3508 cells/uL (ref 850–3900)
MCH: 29.1 pg (ref 27.0–33.0)
MCHC: 34 g/dL (ref 32.0–36.0)
MCV: 85.6 fL (ref 80.0–100.0)
MPV: 11.7 fL (ref 7.5–12.5)
Monocytes Relative: 8.4 %
Neutro Abs: 6854 cells/uL (ref 1500–7800)
Neutrophils Relative %: 59.6 %
Platelets: 266 10*3/uL (ref 140–400)
RBC: 4.57 10*6/uL (ref 3.80–5.10)
RDW: 11.9 % (ref 11.0–15.0)
Total Lymphocyte: 30.5 %
WBC: 11.5 10*3/uL — ABNORMAL HIGH (ref 3.8–10.8)

## 2019-09-16 LAB — COMPREHENSIVE METABOLIC PANEL
AG Ratio: 1.7 (calc) (ref 1.0–2.5)
ALT: 16 U/L (ref 6–29)
AST: 15 U/L (ref 10–30)
Albumin: 4.5 g/dL (ref 3.6–5.1)
Alkaline phosphatase (APISO): 35 U/L (ref 31–125)
BUN: 17 mg/dL (ref 7–25)
CO2: 24 mmol/L (ref 20–32)
Calcium: 9.7 mg/dL (ref 8.6–10.2)
Chloride: 103 mmol/L (ref 98–110)
Creat: 0.71 mg/dL (ref 0.50–1.10)
Globulin: 2.6 g/dL (calc) (ref 1.9–3.7)
Glucose, Bld: 86 mg/dL (ref 65–99)
Potassium: 4 mmol/L (ref 3.5–5.3)
Sodium: 136 mmol/L (ref 135–146)
Total Bilirubin: 0.5 mg/dL (ref 0.2–1.2)
Total Protein: 7.1 g/dL (ref 6.1–8.1)

## 2019-09-16 LAB — LIPID PANEL
Cholesterol: 203 mg/dL — ABNORMAL HIGH (ref <200)
HDL: 57 mg/dL (ref >=50)
LDL Cholesterol (Calc): 126 mg/dL (calc) — ABNORMAL HIGH
Non-HDL Cholesterol (Calc): 146 mg/dL (calc) — ABNORMAL HIGH (ref <130)
Total CHOL/HDL Ratio: 3.6 (calc) (ref <5.0)
Triglycerides: 101 mg/dL (ref <150)

## 2019-09-16 LAB — FOLATE: Folate: 23.5 ng/mL

## 2019-09-16 LAB — VITAMIN B12: Vitamin B-12: 442 pg/mL (ref 200–1100)

## 2019-09-16 LAB — VITAMIN D 25 HYDROXY (VIT D DEFICIENCY, FRACTURES): Vit D, 25-Hydroxy: 27 ng/mL — ABNORMAL LOW (ref 30–100)

## 2019-09-16 LAB — TSH: TSH: 1.29 mIU/L

## 2019-09-16 NOTE — Assessment & Plan Note (Signed)
-  Td 2014,  2019 pe pt - flu shot today - Female care -- per gyn  - CCS: never had a cscope  - Diet and exercise: Doing well, eating healthy, staying active - Labs: CMP, FLP, CBC, TSH, also likes vitamins check: Vitamin D, [id number] and folic acid

## 2019-09-21 MED ORDER — VITAMIN D (ERGOCALCIFEROL) 1.25 MG (50000 UNIT) PO CAPS: 50000.0000 [IU] | ORAL_CAPSULE | ORAL | 0 refills | Status: DC

## 2019-09-21 NOTE — Addendum Note (Signed)
Addended by: Damita Dunnings D on: 09/21/2019 11:11 AM   Modules accepted: Orders

## 2019-09-25 ENCOUNTER — Other Ambulatory Visit: Payer: Self-pay

## 2019-09-25 ENCOUNTER — Ambulatory Visit (HOSPITAL_BASED_OUTPATIENT_CLINIC_OR_DEPARTMENT_OTHER)
Admission: RE | Admit: 2019-09-25 | Discharge: 2019-09-25 | Disposition: A | Discharge status: Home / Self Care | Payer: BC Managed Care – PPO | Source: Ambulatory Visit | Attending: Internal Medicine | Admitting: Internal Medicine

## 2019-09-25 DIAGNOSIS — E041 Nontoxic single thyroid nodule: Secondary | ICD-10-CM | POA: Diagnosis not present

## 2019-09-25 DIAGNOSIS — E042 Nontoxic multinodular goiter: Secondary | ICD-10-CM | POA: Diagnosis not present

## 2019-09-27 ENCOUNTER — Other Ambulatory Visit: Payer: Self-pay

## 2019-09-27 DIAGNOSIS — E041 Nontoxic single thyroid nodule: Secondary | ICD-10-CM

## 2019-10-30 DIAGNOSIS — N819 Female genital prolapse, unspecified: Secondary | ICD-10-CM | POA: Diagnosis not present

## 2020-09-17 DIAGNOSIS — N8110 Cystocele, unspecified: Secondary | ICD-10-CM | POA: Diagnosis not present

## 2020-09-18 ENCOUNTER — Ambulatory Visit (INDEPENDENT_AMBULATORY_CARE_PROVIDER_SITE_OTHER): Payer: BC Managed Care – PPO | Admitting: Internal Medicine

## 2020-09-18 ENCOUNTER — Ambulatory Visit (HOSPITAL_BASED_OUTPATIENT_CLINIC_OR_DEPARTMENT_OTHER)
Admission: RE | Admit: 2020-09-18 | Discharge: 2020-09-18 | Disposition: A | Discharge status: Home / Self Care | Payer: BC Managed Care – PPO | Source: Ambulatory Visit | Attending: Internal Medicine | Admitting: Internal Medicine

## 2020-09-18 ENCOUNTER — Encounter: Payer: Self-pay | Admitting: Internal Medicine

## 2020-09-18 ENCOUNTER — Other Ambulatory Visit: Payer: Self-pay

## 2020-09-18 VITALS — BP 128/85 | HR 82 | Temp 98.2°F | Resp 16 | Ht 65.0 in | Wt 148.1 lb

## 2020-09-18 DIAGNOSIS — Z0000 Encounter for general adult medical examination without abnormal findings: Secondary | ICD-10-CM | POA: Diagnosis not present

## 2020-09-18 DIAGNOSIS — E559 Vitamin D deficiency, unspecified: Secondary | ICD-10-CM | POA: Diagnosis not present

## 2020-09-18 DIAGNOSIS — E041 Nontoxic single thyroid nodule: Secondary | ICD-10-CM

## 2020-09-18 DIAGNOSIS — E042 Nontoxic multinodular goiter: Secondary | ICD-10-CM | POA: Diagnosis not present

## 2020-09-18 DIAGNOSIS — Z23 Encounter for immunization: Secondary | ICD-10-CM

## 2020-09-18 NOTE — Patient Instructions (Signed)
Start vitamin D3 supplements: 2000 units daily  GO TO THE LAB : Get the blood work     Vanessa Ferguson, [place] Come back for   a second exam in 1 year

## 2020-09-18 NOTE — Progress Notes (Signed)
Pre visit review using our clinic review tool, if applicable. No additional management support is needed unless otherwise documented below in the visit note. 

## 2020-09-18 NOTE — Progress Notes (Signed)
   Subjective:    Patient ID: Vanessa Ferguson, female    DOB: 1980/12/16, 39 y.o.   MRN: [id number]  DOS:  09/18/2020 Type of visit - description: CPX  Since the last office visit she is doing well. She is extremely busy, she owns a restaurant. Other than physical fatigue she is doing well, stress is okay.  Review of Systems  Other than above, a 14 point review of systems is negative      Past Medical History:  Diagnosis Date  . AMA (advanced maternal age) multigravida 47+   . Fetal cardiac anomaly affecting pregnancy, antepartum   . Fetal renal anomaly, single gestation   . Fibroid   . Infertility, female   . Thyroid disease affecting pregnancy   . Thyroid nodule    R thyroid nodule,used to see Dr Harlow Asa  , s/pFNA (-) aprox 2008  . Vaginal Pap smear, abnormal    HPV    Past Surgical History:  Procedure Laterality Date  . NO PAST SURGERIES      Allergies as of 09/18/2020   No Known Allergies     Medication List       Accurate as of September 18, 2020 11:59 PM. If you have any questions, ask your nurse or doctor.        STOP taking these medications   Vitamin D (Ergocalciferol) 1.25 MG (50000 UNIT) Caps capsule Commonly known as: DRISDOL Stopped by: Kathlene November, MD     TAKE these medications   Vitamin D 50 MCG (2000 UT) tablet Take 2,000 Units by mouth daily.          Objective:   Physical Exam BP 128/85 (BP Location: Left Arm, Patient Position: Sitting, Cuff Size: Small)   Pulse 82   Temp 98.2 F (36.8 C) (Oral)   Resp 16   Ht 5\' 5"  (1.651 m)   Wt 148 lb 2 oz (67.2 kg)   SpO2 100%   BMI 24.65 kg/m  General: Well developed, NAD, BMI noted Neck: No  thyromegaly  HEENT:  Normocephalic . Face symmetric, atraumatic Neck: Has a thyroid nodule on the right side, not tender. Lungs:  CTA B Normal respiratory effort, no intercostal retractions, no accessory muscle use. Heart: RRR,  no murmur.  Abdomen:  Not distended, soft, non-tender. No rebound or  rigidity.   Lower extremities: no pretibial edema bilaterally  Skin: Exposed areas without rash. Not pale. Not jaundice Neurologic:  alert & oriented X3.  Speech normal, gait appropriate for age and unassisted Strength symmetric and appropriate for age.  Psych: Cognition and judgment appear intact.  Cooperative with normal attention span and concentration.  Behavior appropriate. No anxious or depressed appearing.     Assessment      Assessment Thyroid nodule, FNA  2008, 2012 negative History of infertility, first child born ~ 2010, 2 child: 9/202019 (IVF) Vitamin D deficiency Not on BCPs   PLAN: Here for CPX Thyroid nodule: Had a ultrasound done today, results pending. Vitamin D deficiency: Currently not taking any supplements, recommend 2000 units daily, checking labs. RTC 1 year    This visit occurred during the SARS-CoV-2 public health emergency.  Safety protocols were in place, including screening questions prior to the visit, additional usage of staff PPE, and extensive cleaning of exam room while observing appropriate contact time as indicated for disinfecting solutions.

## 2020-09-19 ENCOUNTER — Encounter: Payer: Self-pay | Admitting: Internal Medicine

## 2020-09-19 LAB — COMPREHENSIVE METABOLIC PANEL
ALT: 15 U/L (ref 0–35)
AST: 13 U/L (ref 0–37)
Albumin: 4.5 g/dL (ref 3.5–5.2)
Alkaline Phosphatase: 37 U/L — ABNORMAL LOW (ref 39–117)
BUN: 18 mg/dL (ref 6–23)
CO2: 27 mEq/L (ref 19–32)
Calcium: 9.7 mg/dL (ref 8.4–10.5)
Chloride: 103 mEq/L (ref 96–112)
Creatinine, Ser: 0.72 mg/dL (ref 0.40–1.20)
GFR: 104.77 mL/min (ref >60.00)
Glucose, Bld: 75 mg/dL (ref 70–99)
Potassium: 4.1 mEq/L (ref 3.5–5.1)
Sodium: 136 mEq/L (ref 135–145)
Total Bilirubin: 0.5 mg/dL (ref 0.2–1.2)
Total Protein: 7.4 g/dL (ref 6.0–8.3)

## 2020-09-19 LAB — CBC WITH DIFFERENTIAL/PLATELET
Basophils Absolute: 0.1 10*3/uL (ref 0.0–0.1)
Basophils Relative: 0.8 % (ref 0.0–3.0)
Eosinophils Absolute: 0.2 10*3/uL (ref 0.0–0.7)
Eosinophils Relative: 1.9 % (ref 0.0–5.0)
HCT: 40.1 % (ref 36.0–46.0)
Hemoglobin: 13.7 g/dL (ref 12.0–15.0)
Lymphocytes Relative: 30.4 % (ref 12.0–46.0)
Lymphs Abs: 3.9 10*3/uL (ref 0.7–4.0)
MCHC: 34.2 g/dL (ref 30.0–36.0)
MCV: 85.5 fl (ref 78.0–100.0)
Monocytes Absolute: 1.1 10*3/uL — ABNORMAL HIGH (ref 0.1–1.0)
Monocytes Relative: 8.7 % (ref 3.0–12.0)
Neutro Abs: 7.4 10*3/uL (ref 1.4–7.7)
Neutrophils Relative %: 58.2 % (ref 43.0–77.0)
Platelets: 239 10*3/uL (ref 150.0–400.0)
RBC: 4.69 Mil/uL (ref 3.87–5.11)
RDW: 12.9 % (ref 11.5–15.5)
WBC: 12.7 10*3/uL — ABNORMAL HIGH (ref 4.0–10.5)

## 2020-09-19 LAB — LIPID PANEL
Cholesterol: 213 mg/dL — ABNORMAL HIGH (ref 0–200)
HDL: 53.2 mg/dL (ref >39.00)
LDL Cholesterol: 143 mg/dL — ABNORMAL HIGH (ref 0–99)
NonHDL: 159.73
Total CHOL/HDL Ratio: 4
Triglycerides: 86 mg/dL (ref 0.0–149.0)
VLDL: 17.2 mg/dL (ref 0.0–40.0)

## 2020-09-19 LAB — VITAMIN D 25 HYDROXY (VIT D DEFICIENCY, FRACTURES): VITD: 28.79 ng/mL — ABNORMAL LOW (ref 30.00–100.00)

## 2020-09-19 NOTE — Assessment & Plan Note (Signed)
Here for CPX Thyroid nodule: Had a ultrasound done today, results pending. Vitamin D deficiency: Currently not taking any supplements, recommend 2000 units daily, checking labs. RTC 1 year

## 2020-09-19 NOTE — Assessment & Plan Note (Signed)
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  She is trying to eat healthy most of the time. - Labs: CMP, FLP, CBC, vitamin D.

## 2020-09-23 MED ORDER — VITAMIN D (ERGOCALCIFEROL) 1.25 MG (50000 UNIT) PO CAPS: 50000.0000 [IU] | ORAL_CAPSULE | ORAL | 0 refills | Status: DC

## 2020-09-23 NOTE — Addendum Note (Signed)
Addended byDamita Dunnings D on: 09/23/2020 08:12 AM   Modules accepted: Orders

## 2021-01-15 DIAGNOSIS — Z4689 Encounter for fitting and adjustment of other specified devices: Secondary | ICD-10-CM | POA: Diagnosis not present

## 2021-01-15 DIAGNOSIS — R87610 Atypical squamous cells of undetermined significance on cytologic smear of cervix (ASC-US): Secondary | ICD-10-CM | POA: Insufficient documentation

## 2021-01-15 DIAGNOSIS — E079 Disorder of thyroid, unspecified: Secondary | ICD-10-CM | POA: Insufficient documentation

## 2021-01-15 DIAGNOSIS — N8111 Cystocele, midline: Secondary | ICD-10-CM | POA: Insufficient documentation

## 2021-01-20 ENCOUNTER — Encounter: Payer: Self-pay | Admitting: Internal Medicine

## 2021-02-13 ENCOUNTER — Encounter: Payer: Self-pay | Admitting: Internal Medicine

## 2021-02-26 DIAGNOSIS — R87610 Atypical squamous cells of undetermined significance on cytologic smear of cervix (ASC-US): Secondary | ICD-10-CM | POA: Diagnosis not present

## 2021-02-26 DIAGNOSIS — Z124 Encounter for screening for malignant neoplasm of cervix: Secondary | ICD-10-CM | POA: Diagnosis not present

## 2021-02-26 DIAGNOSIS — Z01419 Encounter for gynecological examination (general) (routine) without abnormal findings: Secondary | ICD-10-CM | POA: Diagnosis not present

## 2021-02-26 DIAGNOSIS — Z6822 Body mass index (BMI) 22.0-22.9, adult: Secondary | ICD-10-CM | POA: Diagnosis not present

## 2021-02-26 LAB — HM PAP SMEAR

## 2021-05-07 DIAGNOSIS — N8111 Cystocele, midline: Secondary | ICD-10-CM | POA: Diagnosis not present

## 2021-05-07 DIAGNOSIS — Z4689 Encounter for fitting and adjustment of other specified devices: Secondary | ICD-10-CM | POA: Diagnosis not present

## 2021-09-27 ENCOUNTER — Telehealth: Payer: Self-pay | Admitting: Internal Medicine

## 2021-09-27 DIAGNOSIS — E041 Nontoxic single thyroid nodule: Secondary | ICD-10-CM

## 2021-09-27 NOTE — Telephone Encounter (Signed)
Due for a thyroid ultrasound, DX nodules, needs surveillance. Please enter the order and notify patient.

## 2021-09-29 NOTE — Addendum Note (Signed)
Addended byDamita Dunnings D on: 09/29/2021 07:46 AM   Modules accepted: Orders

## 2021-09-29 NOTE — Telephone Encounter (Signed)
Order placed, mychart message sent

## 2021-11-25 IMAGING — US US THYROID
1 series · 13 of 25 positions shown · non-contrast
Comparison: 09/25/2019, 03/25/2012

CLINICAL DATA: 39-year-old female with a history of thyroid nodules

EXAM:
THYROID ULTRASOUND
TECHNIQUE: Ultrasound examination of the thyroid gland and adjacent soft
tissues was performed.

[Series 1: us thyroid · 13 of 39 slices shown]
[im 1/39]
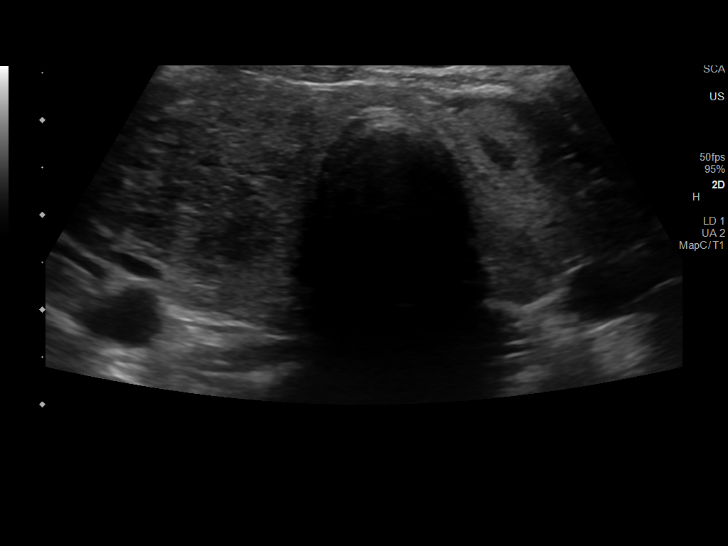
[im 4/39]
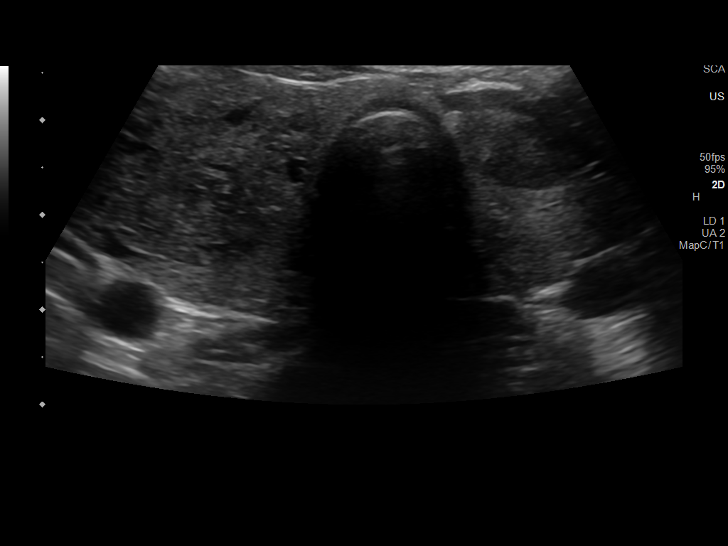
[im 7/39]
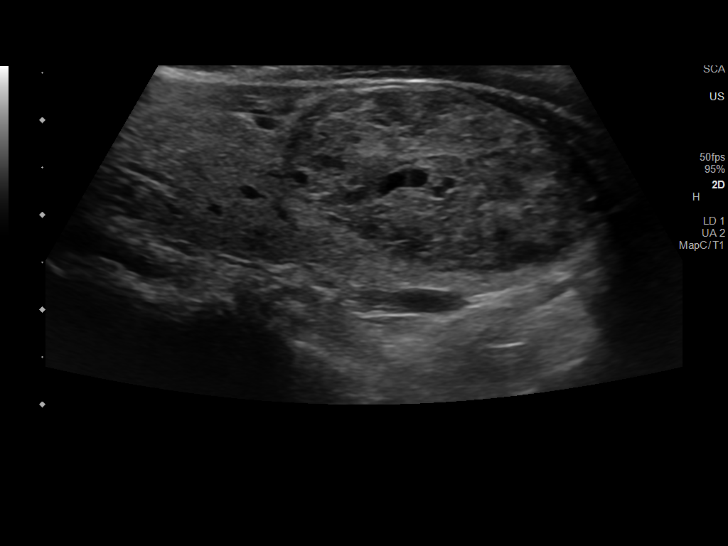
[im 10/39]
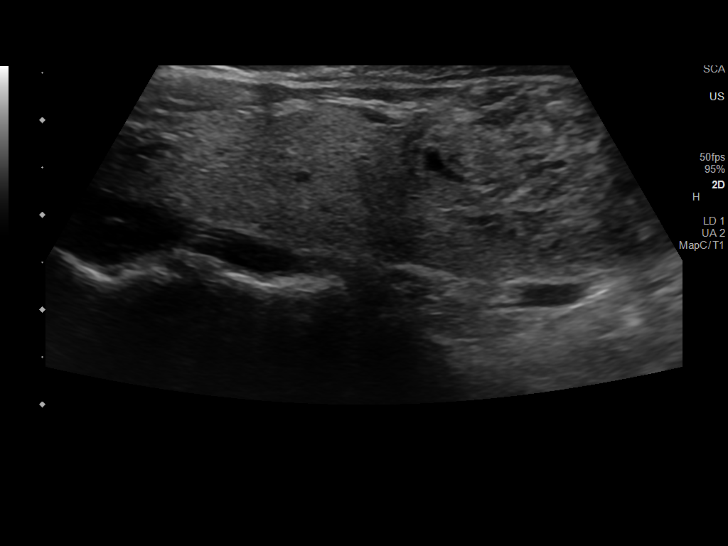
[im 13/39]
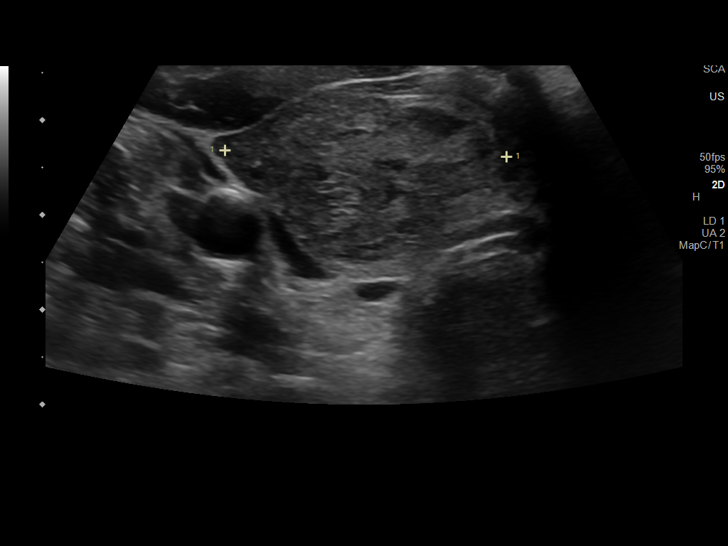
[im 16/39]
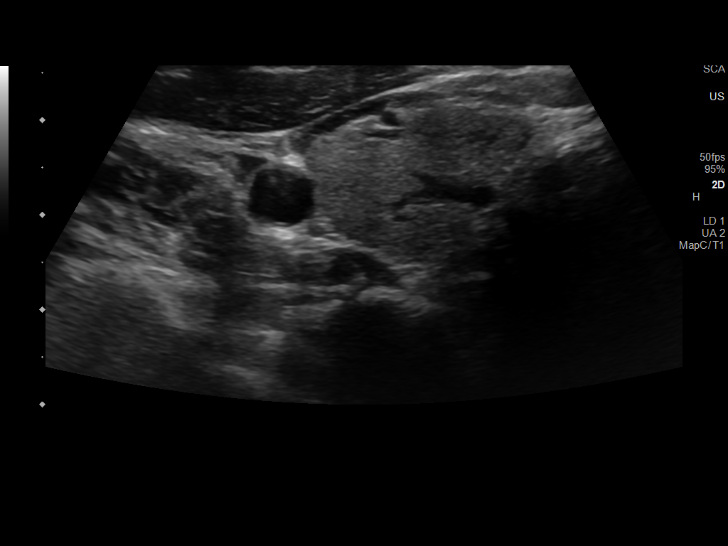
[im 20/39]
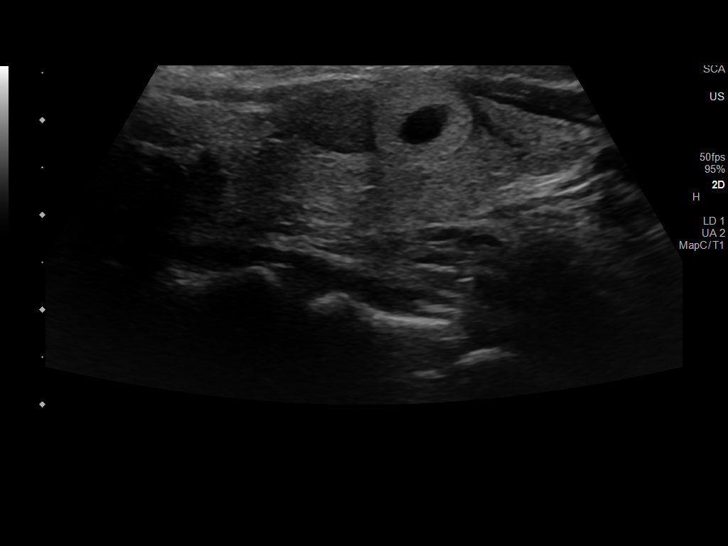
[im 23/39]
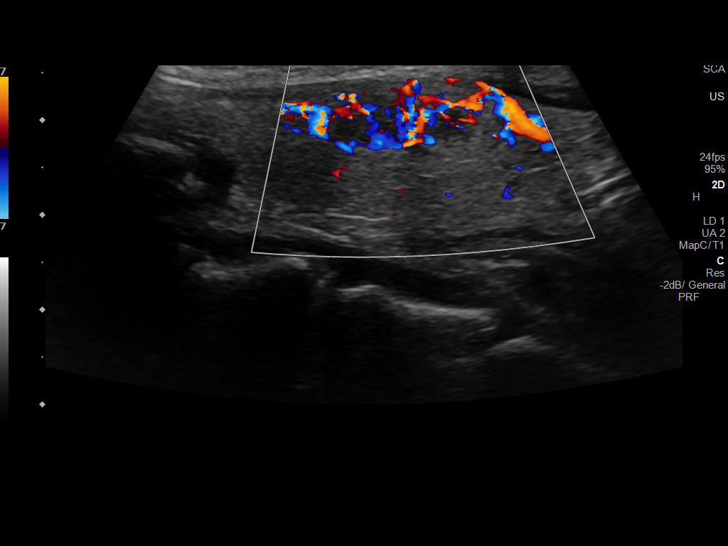
[im 26/39]
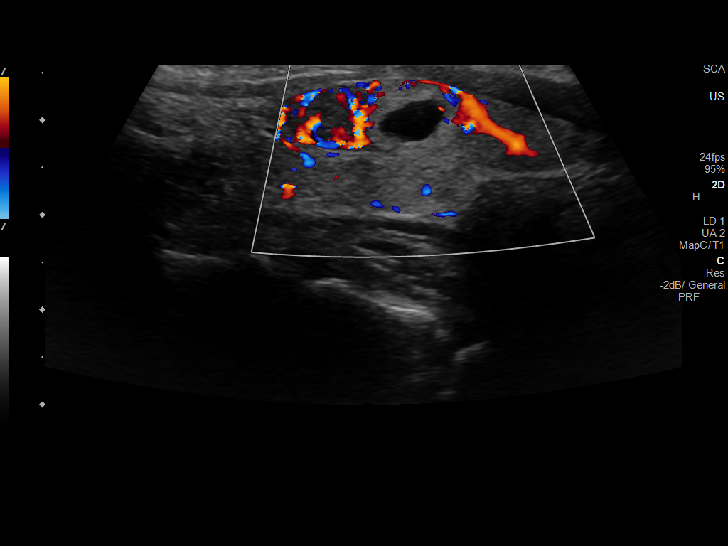
[im 29/39]
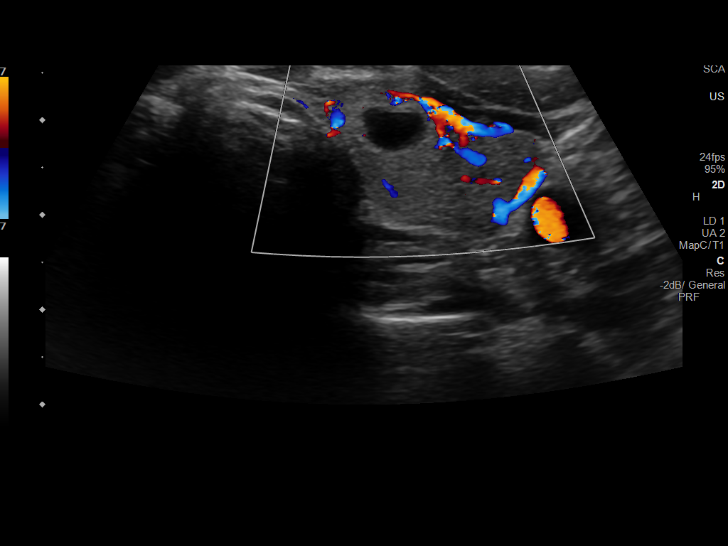
[im 32/39]
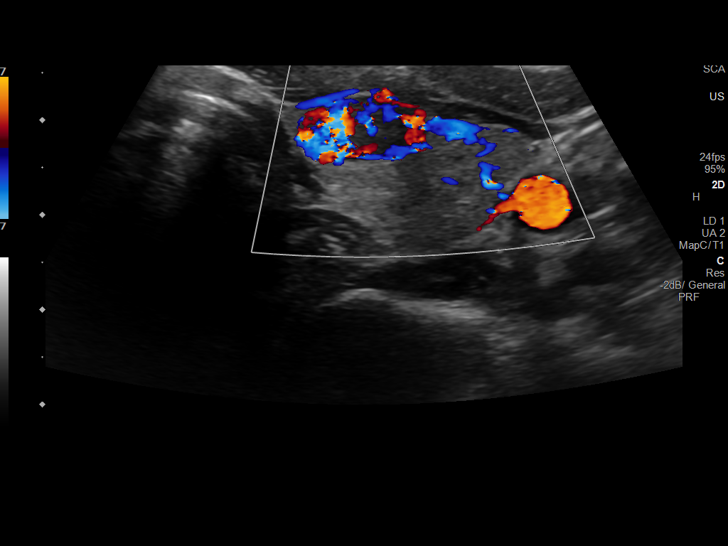
[im 35/39]
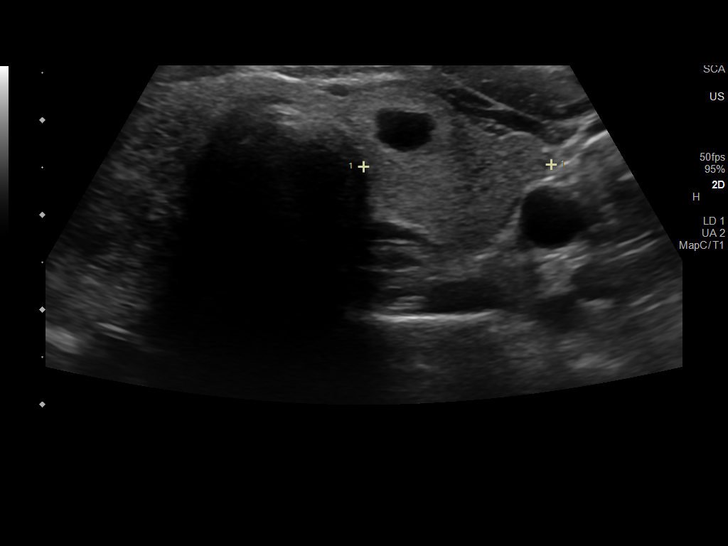
[im 39/39]
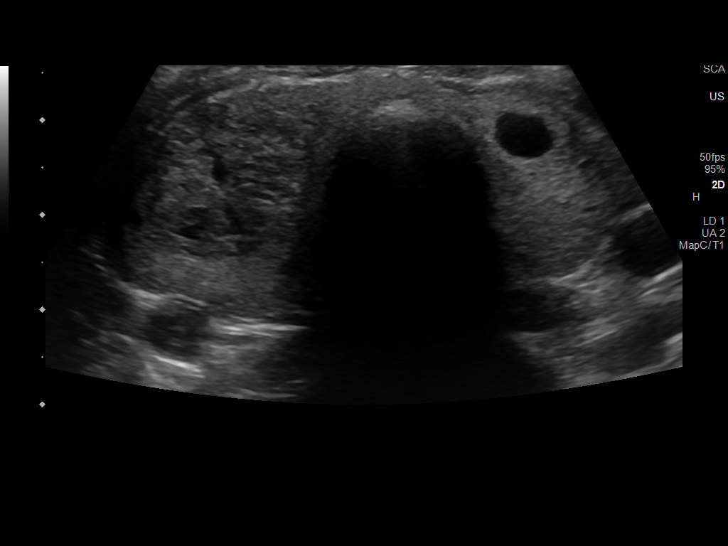

[13 of 25 positions shown; findings below may reference images not displayed]

FINDINGS: Parenchymal Echotexture: Mildly heterogenous

Isthmus: 0.3 cm

Right lobe: 5.8 cm x 2.0 cm x 2.6 cm

Left lobe: 4.7 cm x 1.5 cm x 2.0 cm

_________________________________________________________

Estimated total number of nodules >/= 1 cm: 3

Number of spongiform nodules >/=  2 cm not described below (TR1): 0

Number of mixed cystic and solid nodules >/= 1.5 cm not described
below (TR2): 0

_________________________________________________________

Nodule labeled 1 in the right mid thyroid, 3.4 cm. This remains TR 4
and meets criteria for biopsy.

Nodule labeled 2 in the mid left thyroid, 1.3 cm. This remains TR 4
and meets criteria for surveillance.

Cystic nodule at the inferior left thyroid, 1.1 cm, does not meet
criteria for surveillance or biopsy.

No adenopathy
IMPRESSION: Multinodular thyroid again demonstrated.

Left mid thyroid nodule (labeled 2, 1.3 cm, TR 4) meets criteria for
surveillance, as designated by the newly established ACR TI-RADS
criteria. Surveillance ultrasound study recommended to be performed
annually up to 5 years.

Right mid thyroid nodule, 3.4 cm, which meets ACR TIRADS criteria
for biopsy. Either referral for biopsy or continued surveillance may
be reasonable.

Recommendations follow those established by the new ACR TI-RADS
criteria ([HOSPITAL] 7572;[DATE]).

## 2021-12-31 ENCOUNTER — Encounter: Payer: Self-pay | Admitting: Internal Medicine

## 2022-02-25 DIAGNOSIS — Z960 Presence of urogenital implants: Secondary | ICD-10-CM | POA: Insufficient documentation

## 2022-03-04 ENCOUNTER — Encounter: Payer: Self-pay | Admitting: Internal Medicine

## 2022-05-11 DIAGNOSIS — Z1231 Encounter for screening mammogram for malignant neoplasm of breast: Secondary | ICD-10-CM | POA: Diagnosis not present

## 2022-05-11 DIAGNOSIS — R87610 Atypical squamous cells of undetermined significance on cytologic smear of cervix (ASC-US): Secondary | ICD-10-CM | POA: Diagnosis not present

## 2022-05-11 DIAGNOSIS — Z01419 Encounter for gynecological examination (general) (routine) without abnormal findings: Secondary | ICD-10-CM | POA: Diagnosis not present

## 2022-05-11 LAB — HM PAP SMEAR: HM Pap smear: NEGATIVE

## 2022-05-11 LAB — HM MAMMOGRAPHY

## 2022-05-20 DIAGNOSIS — R928 Other abnormal and inconclusive findings on diagnostic imaging of breast: Secondary | ICD-10-CM | POA: Diagnosis not present

## 2022-05-20 DIAGNOSIS — N6002 Solitary cyst of left breast: Secondary | ICD-10-CM | POA: Diagnosis not present

## 2022-06-09 DIAGNOSIS — D259 Leiomyoma of uterus, unspecified: Secondary | ICD-10-CM | POA: Diagnosis not present

## 2022-06-09 DIAGNOSIS — N816 Rectocele: Secondary | ICD-10-CM | POA: Diagnosis not present

## 2022-07-01 ENCOUNTER — Encounter: Payer: Self-pay | Admitting: Internal Medicine

## 2022-07-15 ENCOUNTER — Other Ambulatory Visit: Payer: Self-pay | Admitting: Obstetrics and Gynecology

## 2022-07-21 ENCOUNTER — Encounter (HOSPITAL_BASED_OUTPATIENT_CLINIC_OR_DEPARTMENT_OTHER): Payer: Self-pay | Admitting: Obstetrics and Gynecology

## 2022-07-21 NOTE — Progress Notes (Signed)
Spoke w/ via phone for pre-op interview--- Vanessa Ferguson needs dos----     CBC, UPT and T&S per Psychologist, sport and exercise.          Ferguson results------ COVID test -----patient states asymptomatic no test needed Arrive at -------1000 NPO after MN NO Solid Food.  Clear liquids from MN until---0900 Med rec completed Medications to take morning of surgery -----NONE Diabetic medication ----- Patient instructed no nail polish to be worn day of surgery Patient instructed to bring photo id and insurance card day of surgery Patient aware to have Driver (ride ) / caregiver Vanessa Ferguson   for 24 hours after surgery  Patient Special Instructions ----- Pre-Op special Istructions ----- Patient verbalized understanding of instructions that were given at this phone interview. Patient denies shortness of breath, chest pain, fever, cough at this phone interview.

## 2022-08-06 ENCOUNTER — Encounter (HOSPITAL_BASED_OUTPATIENT_CLINIC_OR_DEPARTMENT_OTHER)
Admission: RE | Disposition: A | Discharge status: Home / Self Care | Payer: Self-pay | Source: Home / Self Care | Attending: Obstetrics and Gynecology

## 2022-08-06 ENCOUNTER — Other Ambulatory Visit: Payer: Self-pay

## 2022-08-06 ENCOUNTER — Ambulatory Visit (HOSPITAL_BASED_OUTPATIENT_CLINIC_OR_DEPARTMENT_OTHER): Payer: BC Managed Care – PPO | Admitting: Anesthesiology

## 2022-08-06 ENCOUNTER — Encounter (HOSPITAL_BASED_OUTPATIENT_CLINIC_OR_DEPARTMENT_OTHER): Payer: Self-pay | Admitting: Obstetrics and Gynecology

## 2022-08-06 ENCOUNTER — Ambulatory Visit (HOSPITAL_BASED_OUTPATIENT_CLINIC_OR_DEPARTMENT_OTHER)
Admission: RE | Admit: 2022-08-06 | Discharge: 2022-08-06 | Disposition: A | Discharge status: Home / Self Care | Payer: BC Managed Care – PPO | Attending: Obstetrics and Gynecology | Admitting: Obstetrics and Gynecology

## 2022-08-06 DIAGNOSIS — N8111 Cystocele, midline: Secondary | ICD-10-CM | POA: Diagnosis not present

## 2022-08-06 DIAGNOSIS — N816 Rectocele: Secondary | ICD-10-CM | POA: Insufficient documentation

## 2022-08-06 DIAGNOSIS — N8110 Cystocele, unspecified: Secondary | ICD-10-CM | POA: Diagnosis not present

## 2022-08-06 DIAGNOSIS — N8189 Other female genital prolapse: Secondary | ICD-10-CM | POA: Diagnosis not present

## 2022-08-06 HISTORY — PX: RECTOCELE REPAIR: SHX761

## 2022-08-06 LAB — POCT PREGNANCY, URINE: Preg Test, Ur: NEGATIVE

## 2022-08-06 LAB — CBC
HCT: 40.1 % (ref 36.0–46.0)
Hemoglobin: 13.5 g/dL (ref 12.0–15.0)
MCH: 29.5 pg (ref 26.0–34.0)
MCHC: 33.7 g/dL (ref 30.0–36.0)
MCV: 87.6 fL (ref 80.0–100.0)
Platelets: 223 10*3/uL (ref 150–400)
RBC: 4.58 MIL/uL (ref 3.87–5.11)
RDW: 12.7 % (ref 11.5–15.5)
WBC: 6.8 10*3/uL (ref 4.0–10.5)
nRBC: 0 % (ref 0.0–0.2)

## 2022-08-06 LAB — TYPE AND SCREEN
ABO/RH(D): O POS
Antibody Screen: NEGATIVE

## 2022-08-06 SURGERY — COLPORRHAPHY, POSTERIOR, FOR RECTOCELE REPAIR
Anesthesia: General

## 2022-08-06 MED ORDER — FENTANYL CITRATE (PF) 100 MCG/2ML IJ SOLN: 25.0000 ug | INTRAMUSCULAR | Status: DC | PRN

## 2022-08-06 MED ORDER — FENTANYL CITRATE (PF) 250 MCG/5ML IJ SOLN
INTRAMUSCULAR | Status: DC | PRN
  Administered 2022-08-06 (×2): 25 ug via INTRAVENOUS
  Administered 2022-08-06: 50 ug via INTRAVENOUS

## 2022-08-06 MED ORDER — DEXAMETHASONE SODIUM PHOSPHATE 10 MG/ML IJ SOLN
INTRAMUSCULAR | Status: DC | PRN
  Administered 2022-08-06: 10 mg via INTRAVENOUS

## 2022-08-06 MED ORDER — LIDOCAINE 2% (20 MG/ML) 5 ML SYRINGE
INTRAMUSCULAR | Status: DC | PRN
  Administered 2022-08-06: 40 mg via INTRAVENOUS

## 2022-08-06 MED ORDER — PROPOFOL 10 MG/ML IV BOLUS
INTRAVENOUS | Status: AC
  Filled 2022-08-06: qty 20

## 2022-08-06 MED ORDER — 0.9 % SODIUM CHLORIDE (POUR BTL) OPTIME
TOPICAL | Status: DC | PRN
  Administered 2022-08-06: 500 mL

## 2022-08-06 MED ORDER — ONDANSETRON HCL 4 MG/2ML IJ SOLN
INTRAMUSCULAR | Status: DC | PRN
  Administered 2022-08-06: 4 mg via INTRAVENOUS

## 2022-08-06 MED ORDER — KETOROLAC TROMETHAMINE 30 MG/ML IJ SOLN
INTRAMUSCULAR | Status: DC | PRN
  Administered 2022-08-06: 30 mg via INTRAVENOUS

## 2022-08-06 MED ORDER — PROPOFOL 10 MG/ML IV BOLUS
INTRAVENOUS | Status: DC | PRN
  Administered 2022-08-06: 200 mg via INTRAVENOUS

## 2022-08-06 MED ORDER — LACTATED RINGERS IV SOLN
INTRAVENOUS | Status: DC
  Administered 2022-08-06: 1000 mL via INTRAVENOUS

## 2022-08-06 MED ORDER — BUPIVACAINE-EPINEPHRINE 0.25% -1:200000 IJ SOLN
INTRAMUSCULAR | Status: DC | PRN
  Administered 2022-08-06: 10 mL
  Administered 2022-08-06: 30 mL

## 2022-08-06 MED ORDER — MIDAZOLAM HCL 2 MG/2ML IJ SOLN
INTRAMUSCULAR | Status: DC | PRN
  Administered 2022-08-06: 2 mg via INTRAVENOUS

## 2022-08-06 MED ORDER — ACETAMINOPHEN 500 MG PO TABS
ORAL_TABLET | ORAL | Status: AC
  Filled 2022-08-06: qty 2

## 2022-08-06 MED ORDER — POVIDONE-IODINE 10 % EX SWAB: 2.0000 | Freq: Once | CUTANEOUS | Status: DC

## 2022-08-06 MED ORDER — CEFAZOLIN SODIUM-DEXTROSE 2-4 GM/100ML-% IV SOLN
INTRAVENOUS | Status: AC
  Filled 2022-08-06: qty 100

## 2022-08-06 MED ORDER — PHENYLEPHRINE 80 MCG/ML (10ML) SYRINGE FOR IV PUSH (FOR BLOOD PRESSURE SUPPORT)
PREFILLED_SYRINGE | INTRAVENOUS | Status: DC | PRN
  Administered 2022-08-06 (×2): 80 ug via INTRAVENOUS
  Administered 2022-08-06: 160 ug via INTRAVENOUS

## 2022-08-06 MED ORDER — MIDAZOLAM HCL 2 MG/2ML IJ SOLN
INTRAMUSCULAR | Status: AC
  Filled 2022-08-06: qty 2

## 2022-08-06 MED ORDER — TRAMADOL HCL 50 MG PO TABS: 50.0000 mg | ORAL_TABLET | Freq: Four times a day (QID) | ORAL | 0 refills | Status: DC | PRN

## 2022-08-06 MED ORDER — FENTANYL CITRATE (PF) 100 MCG/2ML IJ SOLN
INTRAMUSCULAR | Status: AC
  Filled 2022-08-06: qty 2

## 2022-08-06 MED ORDER — ACETAMINOPHEN 500 MG PO TABS
1000.0000 mg | ORAL_TABLET | Freq: Once | ORAL | Status: AC
  Administered 2022-08-06: 1000 mg via ORAL

## 2022-08-06 MED ORDER — CEFAZOLIN SODIUM-DEXTROSE 2-4 GM/100ML-% IV SOLN
2.0000 g | INTRAVENOUS | Status: AC
  Administered 2022-08-06: 2 g via INTRAVENOUS

## 2022-08-06 SURGICAL SUPPLY — 29 items
BLADE SURG 15 STRL LF DISP TIS (BLADE) ×1 IMPLANT
BLADE SURG 15 STRL SS (BLADE) ×1
ELECT REM PT RETURN 9FT ADLT (ELECTROSURGICAL) ×1
ELECTRODE REM PT RTRN 9FT ADLT (ELECTROSURGICAL) ×1 IMPLANT
GLOVE BIO SURGEON STRL SZ7 (GLOVE) IMPLANT
GLOVE BIO SURGEON STRL SZ7.5 (GLOVE) ×2 IMPLANT
GLOVE BIOGEL PI IND STRL 6.5 (GLOVE) IMPLANT
GLOVE ECLIPSE 6.5 STRL STRAW (GLOVE) IMPLANT
GLOVE ECLIPSE 7.0 STRL STRAW (GLOVE) IMPLANT
GOWN STRL REUS W/TWL LRG LVL3 (GOWN DISPOSABLE) ×2 IMPLANT
KIT TURNOVER CYSTO (KITS) ×1 IMPLANT
LEGGING LITHOTOMY PAIR STRL (DRAPES) IMPLANT
MANIFOLD NEPTUNE II (INSTRUMENTS) ×1 IMPLANT
NS IRRIG 500ML POUR BTL (IV SOLUTION) ×1 IMPLANT
PACK VAGINAL WOMENS (CUSTOM PROCEDURE TRAY) ×1 IMPLANT
PAD OB MATERNITY 4.3X12.25 (PERSONAL CARE ITEMS) ×1 IMPLANT
PAD PREP 24X48 CUFFED NSTRL (MISCELLANEOUS) ×1 IMPLANT
SUT MON AB 2-0 CT2 27 (SUTURE) IMPLANT
SUT MON AB 2-0 SH 27 (SUTURE) IMPLANT
SUT VIC 2-0 SH IMPLANT
SUT VIC AB 0 CT1 27 (SUTURE) ×2
SUT VIC AB 0 CT1 27XBRD ANBCTR (SUTURE) ×2 IMPLANT
SUT VIC AB 2-0 SH 27 (SUTURE) ×1
SUT VIC AB 2-0 SH 27XBRD (SUTURE) IMPLANT
SUT VIC AB 3-0 SH 27 (SUTURE) ×3
SUT VIC AB 3-0 SH 27XBRD (SUTURE) IMPLANT
SUT VICRYL 3 0 CT 3 (SUTURE) IMPLANT
TOWEL OR 17X26 10 PK STRL BLUE (TOWEL DISPOSABLE) ×1 IMPLANT
TRAY FOLEY W/BAG SLVR 14FR LF (SET/KITS/TRAYS/PACK) ×1 IMPLANT

## 2022-08-06 NOTE — Discharge Instructions (Signed)
     No acetaminophen/Tylenol until after 4:45 pm today if needed.    No ibuprofen, Advil, Aleve, Motrin, ketorolac, meloxicam, naproxen, or other NSAIDS until after 7:00 pm today if needed.      Post Anesthesia Home Care Instructions  Activity: Get plenty of rest for the remainder of the day. A responsible individual must stay with you for 24 hours following the procedure.  For the next 24 hours, DO NOT: -Drive a car -Paediatric nurse -Drink alcoholic beverages -Take any medication unless instructed by your physician -Make any legal decisions or sign important papers.  Meals: Start with liquid foods such as gelatin or soup. Progress to regular foods as tolerated. Avoid greasy, spicy, heavy foods. If nausea and/or vomiting occur, drink only clear liquids until the nausea and/or vomiting subsides. Call your physician if vomiting continues.  Special Instructions/Symptoms: Your throat may feel dry or sore from the anesthesia or the breathing tube placed in your throat during surgery. If this causes discomfort, gargle with warm salt water. The discomfort should disappear within 24 hours.

## 2022-08-06 NOTE — Anesthesia Procedure Notes (Signed)
Procedure Name: LMA Insertion Date/Time: 08/06/2022 12:14 PM  Performed by: Clearnce Sorrel, CRNAPre-anesthesia Checklist: Patient identified, Emergency Drugs available, Suction available and Patient being monitored Patient Re-evaluated:Patient Re-evaluated prior to induction Oxygen Delivery Method: Circle System Utilized Preoxygenation: Pre-oxygenation with 100% oxygen Induction Type: IV induction Ventilation: Mask ventilation without difficulty LMA: LMA inserted LMA Size: 4.0 Number of attempts: 1 Airway Equipment and Method: Bite block Placement Confirmation: positive ETCO2 Tube secured with: Tape Dental Injury: Teeth and Oropharynx as per pre-operative assessment

## 2022-08-06 NOTE — Transfer of Care (Signed)
Immediate Anesthesia Transfer of Care Note  Patient: Vanessa Ferguson  Procedure(s) Performed: ANTERIOR AND POSTERIOR REPAIR (RECTOCELE);  PERINEORRAPHY  Patient Location: PACU  Anesthesia Type:General  Level of Consciousness: drowsy and patient cooperative  Airway & Oxygen Therapy: Patient Spontanous Breathing  Post-op Assessment: Report given to RN and Post -op Vital signs reviewed and stable  Post vital signs: Reviewed and stable  Last Vitals:  Vitals Value Taken Time  BP 109/69 08/06/22 1330  Temp    Pulse 67 08/06/22 1333  Resp 20 08/06/22 1333  SpO2 98 % 08/06/22 1333  Vitals shown include unvalidated device data.  Last Pain:  Vitals:   08/06/22 1032  TempSrc: Oral  PainSc: 0-No pain      Patients Stated Pain Goal: 5 (91/22/58 3462)  Complications: No notable events documented.

## 2022-08-06 NOTE — Anesthesia Preprocedure Evaluation (Addendum)
Anesthesia Evaluation  Patient identified by MRN, date of birth, ID band Patient awake    Reviewed: Allergy & Precautions, NPO status , Patient's Chart, lab work & pertinent test results  Airway Mallampati: II  TM Distance: >3 FB Neck ROM: Full    Dental no notable dental hx. (+) Teeth Intact, Dental Advisory Given   Pulmonary neg pulmonary ROS,    Pulmonary exam normal breath sounds clear to auscultation       Cardiovascular negative cardio ROS Normal cardiovascular exam Rhythm:Regular Rate:Normal     Neuro/Psych negative neurological ROS  negative psych ROS   GI/Hepatic negative GI ROS, Neg liver ROS,   Endo/Other  negative endocrine ROS  Renal/GU negative Renal ROS  negative genitourinary   Musculoskeletal negative musculoskeletal ROS (+)   Abdominal   Peds  Hematology negative hematology ROS (+)   Anesthesia Other Findings   Reproductive/Obstetrics                            Anesthesia Physical Anesthesia Plan  ASA: 1  Anesthesia Plan: General   Post-op Pain Management: Tylenol PO (pre-op)*   Induction: Intravenous  PONV Risk Score and Plan: 3 and Midazolam, Dexamethasone and Ondansetron  Airway Management Planned: Oral ETT and LMA  Additional Equipment:   Intra-op Plan:   Post-operative Plan: Extubation in OR  Informed Consent: I have reviewed the patients History and Physical, chart, labs and discussed the procedure including the risks, benefits and alternatives for the proposed anesthesia with the patient or authorized representative who has indicated his/her understanding and acceptance.     Dental advisory given  Plan Discussed with: CRNA  Anesthesia Plan Comments:         Anesthesia Quick Evaluation

## 2022-08-06 NOTE — H&P (Signed)
Vanessa Ferguson is an 41 y.o. female. Symptomatic pelvic relaxation for transvaginal rectocele repair and perineorraphy.  Pertinent Gynecological History: Menses: flow is moderate Bleeding: na Contraception: none DES exposure: denies Blood transfusions: none Sexually transmitted diseases: no past history Previous GYN Procedures:  na   Last mammogram: normal Date: 2022 Last pap: normal Date: 2022 OB History: G2, P2   Menstrual History: Menarche age: 32 Patient's last menstrual period was 07/20/2022 (exact date).    Past Medical History:  Diagnosis Date   AMA (advanced maternal age) multigravida 35+    Fetal cardiac anomaly affecting pregnancy, antepartum    Fetal renal anomaly, single gestation    Fibroid    Infertility, female    Thyroid disease affecting pregnancy    Thyroid nodule    R thyroid nodule,used to see Dr Harlow Asa  , s/pFNA (-) aprox 2008   Vaginal Pap smear, abnormal    HPV    Past Surgical History:  Procedure Laterality Date   NO PAST SURGERIES      Family History  Problem Relation Age of Onset   Thyroid nodules Mother    Diabetes Father        dx at age 48   Cancer Maternal Grandfather    CAD Neg Hx    Stroke Neg Hx    Colon cancer Neg Hx    Breast cancer Neg Hx     Social History:  reports that she has never smoked. She has never used smokeless tobacco. She reports that she does not currently use alcohol. She reports that she does not use drugs.  Allergies: No Known Allergies  Medications Prior to Admission  Medication Sig Dispense Refill Last Dose   Multiple Vitamin (MULTIVITAMIN WITH MINERALS) TABS tablet Take 1 tablet by mouth daily.   08/03/2022   Cholecalciferol (VITAMIN D) 50 MCG (2000 UT) tablet Take 2,000 Units by mouth daily.   More than a month   Vitamin D, Ergocalciferol, (DRISDOL) 1.25 MG (50000 UNIT) CAPS capsule Take 1 capsule (50,000 Units total) by mouth every 7 (seven) days. 12 capsule 0 More than a month    Review of Systems   Constitutional: Negative.   All other systems reviewed and are negative.   Blood pressure 120/77, pulse 70, temperature (!) 97.4 F (36.3 C), temperature source Oral, resp. rate 15, height '5\' 5"'$  (1.651 m), weight 67.8 kg, last menstrual period 07/20/2022, SpO2 96 %, currently breastfeeding. Physical Exam Constitutional:      Appearance: Normal appearance. She is normal weight.  HENT:     Head: Normocephalic and atraumatic.  Cardiovascular:     Rate and Rhythm: Normal rate and regular rhythm.     Pulses: Normal pulses.     Heart sounds: Normal heart sounds.  Pulmonary:     Effort: Pulmonary effort is normal.     Breath sounds: Normal breath sounds.  Abdominal:     General: Bowel sounds are normal.     Palpations: Abdomen is soft.  Genitourinary:    General: Normal vulva.  Musculoskeletal:        General: Normal range of motion.     Cervical back: Normal range of motion and neck supple.  Skin:    General: Skin is warm.  Neurological:     General: No focal deficit present.     Mental Status: She is alert.     Results for orders placed or performed during the hospital encounter of 08/06/22 (from the past 24 hour(s))  CBC  Status: None   Collection Time: 08/06/22 10:07 AM  Result Value Ref Range   WBC 6.8 4.0 - 10.5 K/uL   RBC 4.58 3.87 - 5.11 MIL/uL   Hemoglobin 13.5 12.0 - 15.0 g/dL   HCT 40.1 36.0 - 46.0 %   MCV 87.6 80.0 - 100.0 fL   MCH 29.5 26.0 - 34.0 pg   MCHC 33.7 30.0 - 36.0 g/dL   RDW 12.7 11.5 - 15.5 %   Platelets 223 150 - 400 K/uL   nRBC 0.0 0.0 - 0.2 %  Pregnancy, urine POC     Status: None   Collection Time: 08/06/22 10:24 AM  Result Value Ref Range   Preg Test, Ur NEGATIVE NEGATIVE  Type and screen     Status: None (Preliminary result)   Collection Time: 08/06/22 10:45 AM  Result Value Ref Range   ABO/RH(D) PENDING    Antibody Screen PENDING    Sample Expiration      08/09/2022,2359 Performed at [facility], [place]  [address] [place], [place] [address]     No results found.  Assessment/Plan: Symptomatic pelvic relaxation Rectocele repair with perineorraphy. Surgical risks discussed. Consent done.  Marlin Brys J 08/06/2022, 12:00 PM

## 2022-08-06 NOTE — Op Note (Signed)
08/06/2022  1:18 PM  PATIENT:  Vanessa Ferguson  41 y.o. female  PRE-OPERATIVE DIAGNOSIS:  Symptomatic pelvic relaxation with Rectocele  POST-OPERATIVE DIAGNOSIS:  Rectocele, cystocele, perineal relaxation  PROCEDURE:  Procedure(s): POSTERIOR REPAIR (RECTOCELE) ANTERIOR REPAIR(CYSTOCELE) PERINEORRAPHY  SURGEON:  Surgeon(s): Brien Few, MD  ASSISTANTSEddie Dibbles, CNM   ANESTHESIA:   local and general  ESTIMATED BLOOD LOSS: 50CC   DRAINS: none   LOCAL MEDICATIONS USED:  MARCAINE    and Amount: 30 ml  SPECIMEN:  No Specimen  DISPOSITION OF SPECIMEN:  N/A  COUNTS:  YES  DICTATION #: [id number]  PLAN OF CARE: DC HOME  PATIENT DISPOSITION:  PACU - hemodynamically stable.

## 2022-08-06 NOTE — Op Note (Signed)
[name], Vanessa Ferguson MEDICAL RECORD NO: [id number] ACCOUNT NO: [id number] DATE OF BIRTH: [birth year] FACILITY: [facility] LOCATION: [facility] PHYSICIAN: Lovenia Kim, MD  Operative Report   DATE OF PROCEDURE: 08/06/2022  PREOPERATIVE DIAGNOSIS:  Symptomatic pelvic relaxation with rectocele.  POSTOPERATIVE DIAGNOSES:  Rectocele, cystocele and perineal relaxation.  PROCEDURE:  Posterior repair (rectocele repair), anterior repair (cystocele repair), perineorrhaphy.  SURGEON:  Lovenia Kim, MD  ASSISTANT:  Derrell Lolling, CNM  ANESTHESIA:  Local and general.  ESTIMATED BLOOD LOSS:  50 mL  COMPLICATIONS:  None.  DRAINS:  None.  COUNTS:  Correct.  DISPOSITION:  The patient to recovery in good condition.  SPECIMEN:  No specimen to pathology.  BRIEF OPERATIVE NOTE:  After being apprised of the risks of anesthesia, infection, bleeding, injury to surrounding organs, possible need for repair, delayed versus immediate complications including bowel and bladder injury, possible need for repair, the  patient was brought to the operating room.  She was administered general anesthetic without complications.  She was prepped and draped in usual sterile fashion.  Foley catheter placed.  Clear urine noted.  Exam under anesthesia reveals a grade 2  cystocele and a grade 2 rectocele.  No evidence of cervical prolapse.  At this time, the area on the anterior vaginal wall was grasped with an Allis approximately 3 cm below the external urethral meatus, is infiltrated with dilute Marcaine and  epinephrine solution.  A linear incision was made and the vesicovaginal space was entered sharply where the bladder was sharply and bluntly dissected off the anterior vaginal wall. Imbricating sutures of 2-0 Monocryl and 2-0 Vicryl were placed. The  defect was then trimmed and closed using a 2-0 Vicryl in interrupted fashion.  At this time, attention was turned to the posterior portion with perineal relaxation  and obliteration of the perineal body is noted.  The cephalad portion of the rectocele is  identified and tagged using a suture with a rectal exam and the perineum was infiltrated using a dilute Marcaine, epinephrine solution.  At this time, a triangular shape of tissue was excised from the perineum along the area of the perineal body.  The  posterior vaginal mucosa was undermined and the rectovaginal space was entered sharply dissecting the rectum off the posterior vaginal mucosa.  The rectal mucosa tissue was then imbricated using multiple interrupted 2-0 Monocryl and 2-0 Vicryl sutures.   A second imbricating layer was placed, the tissue was trimmed and the rectocele defect was closed using a 2-0 Monocryl suture in an interrupted fashion.  Perineorrhaphy was then performed using 0 Vicryl interrupted sutures to reapproximate the levators  in the midline and recreate the perineal body and then closure with a 2-0 Vicryl suture in standard fashion of perineal repair.  Good hemostasis was noted.  No packing was used.  Urine was clear.  Foley was removed.  Rectal exam revealed intact and well  supported posterior vaginal wall without evidence of suture or defect.  The patient tolerated the procedure well and was awakened and transferred to recovery in good condition.   VAI D: 08/06/2022 1:23:42 pm T: 08/06/2022 9:54:00 pm  JOB: [id number]/ [id number]

## 2022-08-07 ENCOUNTER — Encounter (HOSPITAL_BASED_OUTPATIENT_CLINIC_OR_DEPARTMENT_OTHER): Payer: Self-pay | Admitting: Obstetrics and Gynecology

## 2022-08-10 NOTE — Anesthesia Postprocedure Evaluation (Signed)
Anesthesia Post Note  Patient: Vanessa Ferguson  Procedure(s) Performed: ANTERIOR AND POSTERIOR REPAIR (RECTOCELE);  PERINEORRAPHY     Patient location during evaluation: PACU Anesthesia Type: General Level of consciousness: awake and alert Pain management: pain level controlled Vital Signs Assessment: post-procedure vital signs reviewed and stable Respiratory status: spontaneous breathing, nonlabored ventilation, respiratory function stable and patient connected to nasal cannula oxygen Cardiovascular status: blood pressure returned to baseline and stable Postop Assessment: no apparent nausea or vomiting Anesthetic complications: no   No notable events documented.  Last Vitals:  Vitals:   08/06/22 1415 08/06/22 1450  BP: (!) 92/48 114/75  Pulse: 71   Resp: 16 15  Temp:  36.6 C  SpO2: 100% 97%    Last Pain:  Vitals:   08/06/22 1450  TempSrc:   PainSc: 0-No pain                 Edye Hainline L Shanel Prazak

## 2022-09-08 DIAGNOSIS — N819 Female genital prolapse, unspecified: Secondary | ICD-10-CM | POA: Diagnosis not present

## 2022-09-21 ENCOUNTER — Other Ambulatory Visit: Payer: Self-pay | Admitting: Urology

## 2022-09-28 MED ORDER — MAGNESIUM CITRATE PO SOLN: 1.0000 | Freq: Once | ORAL | Status: AC

## 2022-10-06 DIAGNOSIS — N819 Female genital prolapse, unspecified: Secondary | ICD-10-CM | POA: Diagnosis not present

## 2022-11-04 ENCOUNTER — Other Ambulatory Visit: Payer: Self-pay | Admitting: Urology

## 2022-11-13 NOTE — Progress Notes (Signed)
PCP - Dr. Larose Kells, [place] 09[birth year]2020 Cardiologist - no  PPM/ICD -  Device Orders -  Rep Notified -   Chest x-ray -  EKG -  Stress Test -  ECHO -  Cardiac Cath -   Sleep Study -  CPAP -   Fasting Blood Sugar -  Checks Blood Sugar _____ times a day  Blood Thinner Instructions: Aspirin Instructions:  ERAS Protcol -no PRE-SURGERY    COVID vaccine -x2  Activity--Able to climb a flight of stairs without SOB or CP Anesthesia review:   Patient denies shortness of breath, fever, cough and chest pain at PAT appointment   All instructions explained to the patient, with a verbal understanding of the material. Patient agrees to go over the instructions while at home for a better understanding. Patient also instructed to self quarantine after being tested for COVID-19. The opportunity to ask questions was provided.

## 2022-11-13 NOTE — Patient Instructions (Addendum)
SURGICAL WAITING ROOM VISITATION  Patients having surgery or a procedure may have no more than 2 support people in the waiting area - these visitors may rotate.    Children under the age of 47 must have an adult with them who is not the patient.  Due to an increase in RSV and influenza rates and associated hospitalizations, children ages 65 and under may not visit patients in [place].  If the patient needs to stay at the hospital during part of their recovery, the visitor guidelines for inpatient rooms apply. Pre-op nurse will coordinate an appropriate time for 1 support person to accompany patient in pre-op.  This support person may not rotate.    Please refer to the [facility] website for the visitor guidelines for Inpatients (after your surgery is over and you are in a regular room).       Your procedure is scheduled on: 11-25-22   Report to [facility] Main Entrance    Report to admitting at      [place] AM   Call this number if you have problems the morning of surgery [phone]   Do not eat food or drink liquids  :After Midnight.          If you have questions, please contact your surgeon's office.  ONE FLEETS ENEMA THE MORNING OF SURGERY FOLLOW BOWEL PREP AND ANY ADDITIONAL PRE OP INSTRUCTIONS YOU RECEIVED FROM YOUR SURGEON'S OFFICE!!!     Oral Hygiene is also important to reduce your risk of infection.                                    Remember - BRUSH YOUR TEETH THE MORNING OF SURGERY WITH YOUR REGULAR TOOTHPASTE  DENTURES WILL BE REMOVED PRIOR TO SURGERY PLEASE DO NOT APPLY "Poly grip" OR ADHESIVES!!!   Do NOT smoke after Midnight   Take these medicines the morning of surgery with A SIP OF WATER: none                      You may not have any metal on your body including hair pins, jewelry, and body piercing             Do not wear make-up, lotions, powders, perfumes/cologne, or deodorant  Do not wear nail polish including gel and S&S,  artificial/acrylic nails, or any other type of covering on natural nails including finger and toenails. If you have artificial nails, gel coating, etc. that needs to be removed by a nail salon please have this removed prior to surgery or surgery may need to be canceled/ delayed if the surgeon/ anesthesia feels like they are unable to be safely monitored.   Do not shave  48 hours prior to surgery.            Do not bring valuables to the hospital. [employer].   Contacts, glasses, dentures or bridgework may not be worn into surgery.   Bring small overnight bag day of surgery.   DO NOT [place]. PHARMACY WILL DISPENSE MEDICATIONS LISTED ON YOUR MEDICATION LIST TO YOU DURING YOUR ADMISSION [place]!    Patients discharged on the day of surgery will not be allowed to drive home.  Someone NEEDS to stay  with you for the first 24 hours after anesthesia.                 Please read over the following fact sheets you were given: IF [place] [phone]   If you received a COVID test during your pre-op visit  it is requested that you wear a mask when out in public, stay away from anyone that may not be feeling well and notify your surgeon if you develop symptoms. If you test positive for Covid or have been in contact with anyone that has tested positive in the last 10 days please notify you surgeon.    [place] - Preparing for Surgery Before surgery, you can play an important role.  Because skin is not sterile, your skin needs to be as free of germs as possible.  You can reduce the number of germs on your skin by washing with CHG (chlorahexidine gluconate) soap before surgery.  CHG is an antiseptic cleaner which kills germs and bonds with the skin to continue killing germs even after washing. Please DO NOT use if you have an allergy to CHG or antibacterial  soaps.  If your skin becomes reddened/irritated stop using the CHG and inform your nurse when you arrive at Short Stay. Do not shave (including legs and underarms) for at least 48 hours prior to the first CHG shower.  You may shave your face/neck. Please follow these instructions carefully:  1.  Shower with CHG Soap the night before surgery and the  morning of Surgery.  2.  If you choose to wash your hair, wash your hair first as usual with your  normal  shampoo.  3.  After you shampoo, rinse your hair and body thoroughly to remove the  shampoo.                           4.  Use CHG as you would any other liquid soap.  You can apply chg directly  to the skin and wash                       Gently with a scrungie or clean washcloth.  5.  Apply the CHG Soap to your body ONLY FROM THE NECK DOWN.   Do not use on face/ open                           Wound or open sores. Avoid contact with eyes, ears mouth and genitals (private parts).                       Wash face,  Genitals (private parts) with your normal soap.             6.  Wash thoroughly, paying special attention to the area where your surgery  will be performed.  7.  Thoroughly rinse your body with warm water from the neck down.  8.  DO NOT shower/wash with your normal soap after using and rinsing off  the CHG Soap.                9.  Pat yourself dry with a clean towel.            10.  Wear clean pajamas.            11.  Place clean  sheets on your bed the night of your first shower and do not  sleep with pets. Day of Surgery : Do not apply any lotions/deodorants the morning of surgery.  Please wear clean clothes to the hospital/surgery center.  FAILURE TO FOLLOW THESE INSTRUCTIONS MAY RESULT IN THE CANCELLATION OF YOUR SURGERY PATIENT SIGNATURE_________________________________  NURSE SIGNATURE__________________________________  ________________________________________________________________________

## 2022-11-17 ENCOUNTER — Other Ambulatory Visit: Payer: Self-pay

## 2022-11-17 ENCOUNTER — Encounter (HOSPITAL_COMMUNITY): Payer: Self-pay

## 2022-11-17 ENCOUNTER — Encounter (HOSPITAL_COMMUNITY)
Admission: RE | Admit: 2022-11-17 | Discharge: 2022-11-17 | Disposition: A | Discharge status: Home / Self Care | Payer: BC Managed Care – PPO | Source: Ambulatory Visit | Attending: Urology | Admitting: Urology

## 2022-11-17 VITALS — BP 117/78 | HR 73 | Temp 98.1°F | Resp 16 | Ht 65.0 in | Wt 154.0 lb

## 2022-11-17 DIAGNOSIS — Z01818 Encounter for other preprocedural examination: Secondary | ICD-10-CM

## 2022-11-17 DIAGNOSIS — Z01812 Encounter for preprocedural laboratory examination: Secondary | ICD-10-CM | POA: Insufficient documentation

## 2022-11-17 HISTORY — DX: Hypothyroidism, unspecified: E03.9

## 2022-11-17 LAB — BASIC METABOLIC PANEL
Anion gap: 7 (ref 5–15)
BUN: 18 mg/dL (ref 6–20)
CO2: 23 mmol/L (ref 22–32)
Calcium: 9.4 mg/dL (ref 8.9–10.3)
Chloride: 107 mmol/L (ref 98–111)
Creatinine, Ser: 0.79 mg/dL (ref 0.44–1.00)
GFR, Estimated: >60 mL/min (ref >60)
Glucose, Bld: 96 mg/dL (ref 70–99)
Potassium: 4.4 mmol/L (ref 3.5–5.1)
Sodium: 137 mmol/L (ref 135–145)

## 2022-11-17 LAB — CBC
HCT: 39.7 % (ref 36.0–46.0)
Hemoglobin: 13.3 g/dL (ref 12.0–15.0)
MCH: 29.5 pg (ref 26.0–34.0)
MCHC: 33.5 g/dL (ref 30.0–36.0)
MCV: 88 fL (ref 80.0–100.0)
Platelets: 258 10*3/uL (ref 150–400)
RBC: 4.51 MIL/uL (ref 3.87–5.11)
RDW: 12.6 % (ref 11.5–15.5)
WBC: 9.9 10*3/uL (ref 4.0–10.5)
nRBC: 0 % (ref 0.0–0.2)

## 2022-11-18 LAB — URINE CULTURE

## 2022-11-25 ENCOUNTER — Encounter (HOSPITAL_COMMUNITY): Payer: Self-pay | Admitting: Urology

## 2022-11-25 ENCOUNTER — Encounter (HOSPITAL_COMMUNITY)
Admission: RE | Disposition: A | Discharge status: Home / Self Care | Payer: Self-pay | Source: Ambulatory Visit | Attending: Urology

## 2022-11-25 ENCOUNTER — Observation Stay (HOSPITAL_COMMUNITY)
Admission: RE | Admit: 2022-11-25 | Discharge: 2022-11-26 | Disposition: A | Discharge status: Home / Self Care | Payer: BC Managed Care – PPO | Source: Ambulatory Visit | Attending: Urology | Admitting: Urology

## 2022-11-25 ENCOUNTER — Ambulatory Visit (HOSPITAL_COMMUNITY): Payer: BC Managed Care – PPO | Admitting: Anesthesiology

## 2022-11-25 ENCOUNTER — Other Ambulatory Visit: Payer: Self-pay

## 2022-11-25 DIAGNOSIS — K429 Umbilical hernia without obstruction or gangrene: Secondary | ICD-10-CM | POA: Insufficient documentation

## 2022-11-25 DIAGNOSIS — N814 Uterovaginal prolapse, unspecified: Secondary | ICD-10-CM | POA: Diagnosis present

## 2022-11-25 DIAGNOSIS — Z01818 Encounter for other preprocedural examination: Secondary | ICD-10-CM

## 2022-11-25 DIAGNOSIS — N8111 Cystocele, midline: Secondary | ICD-10-CM

## 2022-11-25 DIAGNOSIS — N819 Female genital prolapse, unspecified: Principal | ICD-10-CM | POA: Insufficient documentation

## 2022-11-25 HISTORY — PX: ROBOTIC ASSISTED LAPAROSCOPIC HYSTERECTOMY AND SALPINGECTOMY: SHX6379

## 2022-11-25 HISTORY — PX: ROBOTIC ASSISTED LAPAROSCOPIC SACROCOLPOPEXY: SHX5388

## 2022-11-25 LAB — POCT PREGNANCY, URINE: Preg Test, Ur: NEGATIVE

## 2022-11-25 LAB — HEMOGLOBIN AND HEMATOCRIT, BLOOD
HCT: 39.5 % (ref 36.0–46.0)
Hemoglobin: 13.2 g/dL (ref 12.0–15.0)

## 2022-11-25 SURGERY — XI ROBOTIC ASSISTED LAPAROSCOPIC HYSTERECTOMY AND SALPINGECTOMY
Anesthesia: General

## 2022-11-25 MED ORDER — ACETAMINOPHEN 10 MG/ML IV SOLN
INTRAVENOUS | Status: DC | PRN
  Administered 2022-11-25: 1000 mg via INTRAVENOUS

## 2022-11-25 MED ORDER — OXYCODONE HCL 5 MG PO TABS
ORAL_TABLET | ORAL | Status: AC
  Filled 2022-11-25: qty 1

## 2022-11-25 MED ORDER — PROPOFOL 10 MG/ML IV BOLUS
INTRAVENOUS | Status: AC
  Filled 2022-11-25: qty 20

## 2022-11-25 MED ORDER — MIDAZOLAM HCL 2 MG/2ML IJ SOLN
INTRAMUSCULAR | Status: AC
  Filled 2022-11-25: qty 2

## 2022-11-25 MED ORDER — OXYCODONE HCL 5 MG PO TABS
5.0000 mg | ORAL_TABLET | Freq: Once | ORAL | Status: AC | PRN
  Administered 2022-11-25: 5 mg via ORAL

## 2022-11-25 MED ORDER — OXYCODONE HCL 5 MG/5ML PO SOLN: 5.0000 mg | Freq: Once | ORAL | Status: AC | PRN

## 2022-11-25 MED ORDER — ONDANSETRON HCL 4 MG/2ML IJ SOLN
INTRAMUSCULAR | Status: AC
  Filled 2022-11-25: qty 2

## 2022-11-25 MED ORDER — ACETAMINOPHEN 160 MG/5ML PO SOLN: 1000.0000 mg | Freq: Once | ORAL | Status: DC | PRN

## 2022-11-25 MED ORDER — ONDANSETRON HCL 4 MG/2ML IJ SOLN: 4.0000 mg | INTRAMUSCULAR | Status: DC | PRN

## 2022-11-25 MED ORDER — LACTATED RINGERS IR SOLN
Status: DC | PRN
  Administered 2022-11-25: 1000 mL

## 2022-11-25 MED ORDER — DOCUSATE SODIUM 100 MG PO CAPS: 100.0000 mg | ORAL_CAPSULE | Freq: Two times a day (BID) | ORAL | Status: AC

## 2022-11-25 MED ORDER — SULFAMETHOXAZOLE-TRIMETHOPRIM 800-160 MG PO TABS: 1.0000 | ORAL_TABLET | Freq: Two times a day (BID) | ORAL | 0 refills | Status: AC

## 2022-11-25 MED ORDER — LACTATED RINGERS IV SOLN: INTRAVENOUS | Status: DC | PRN

## 2022-11-25 MED ORDER — CEFAZOLIN SODIUM-DEXTROSE 1-4 GM/50ML-% IV SOLN
1.0000 g | Freq: Three times a day (TID) | INTRAVENOUS | Status: AC
  Administered 2022-11-25 – 2022-11-26 (×2): 1 g via INTRAVENOUS
  Filled 2022-11-25 (×2): qty 50

## 2022-11-25 MED ORDER — PROPOFOL 10 MG/ML IV BOLUS
INTRAVENOUS | Status: DC | PRN
  Administered 2022-11-25: 30 mg via INTRAVENOUS
  Administered 2022-11-25: 120 mg via INTRAVENOUS

## 2022-11-25 MED ORDER — ESTRADIOL 0.1 MG/GM VA CREA
TOPICAL_CREAM | VAGINAL | Status: DC | PRN
  Administered 2022-11-25: 1 via VAGINAL

## 2022-11-25 MED ORDER — FENTANYL CITRATE PF 50 MCG/ML IJ SOSY
25.0000 ug | PREFILLED_SYRINGE | INTRAMUSCULAR | Status: DC | PRN
  Administered 2022-11-25 (×2): 50 ug via INTRAVENOUS

## 2022-11-25 MED ORDER — PHENYLEPHRINE 80 MCG/ML (10ML) SYRINGE FOR IV PUSH (FOR BLOOD PRESSURE SUPPORT)
PREFILLED_SYRINGE | INTRAVENOUS | Status: AC
  Filled 2022-11-25: qty 10

## 2022-11-25 MED ORDER — FLEET ENEMA 7-19 GM/118ML RE ENEM: 1.0000 | ENEMA | Freq: Once | RECTAL | Status: DC

## 2022-11-25 MED ORDER — SUGAMMADEX SODIUM 200 MG/2ML IV SOLN
INTRAVENOUS | Status: DC | PRN
  Administered 2022-11-25: 200 mg via INTRAVENOUS

## 2022-11-25 MED ORDER — ACETAMINOPHEN 10 MG/ML IV SOLN: 1000.0000 mg | Freq: Once | INTRAVENOUS | Status: DC | PRN

## 2022-11-25 MED ORDER — LIDOCAINE HCL 2 % IJ SOLN
INTRAMUSCULAR | Status: AC
  Filled 2022-11-25: qty 20

## 2022-11-25 MED ORDER — CHLORHEXIDINE GLUCONATE 0.12 % MT SOLN
15.0000 mL | Freq: Once | OROMUCOSAL | Status: AC
  Administered 2022-11-25: 15 mL via OROMUCOSAL

## 2022-11-25 MED ORDER — ROCURONIUM BROMIDE 10 MG/ML (PF) SYRINGE
PREFILLED_SYRINGE | INTRAVENOUS | Status: AC
  Filled 2022-11-25: qty 10

## 2022-11-25 MED ORDER — FENTANYL CITRATE PF 50 MCG/ML IJ SOSY
PREFILLED_SYRINGE | INTRAMUSCULAR | Status: AC
  Filled 2022-11-25: qty 2

## 2022-11-25 MED ORDER — LIDOCAINE 2% (20 MG/ML) 5 ML SYRINGE
INTRAMUSCULAR | Status: DC | PRN
  Administered 2022-11-25: 60 mg via INTRAVENOUS

## 2022-11-25 MED ORDER — FENTANYL CITRATE (PF) 250 MCG/5ML IJ SOLN
INTRAMUSCULAR | Status: DC | PRN
  Administered 2022-11-25: 50 ug via INTRAVENOUS
  Administered 2022-11-25: 25 ug via INTRAVENOUS
  Administered 2022-11-25: 125 ug via INTRAVENOUS
  Administered 2022-11-25: 50 ug via INTRAVENOUS

## 2022-11-25 MED ORDER — CEFAZOLIN SODIUM-DEXTROSE 2-4 GM/100ML-% IV SOLN
2.0000 g | INTRAVENOUS | Status: AC
  Administered 2022-11-25: 2 g via INTRAVENOUS
  Filled 2022-11-25: qty 100

## 2022-11-25 MED ORDER — ROCURONIUM BROMIDE 10 MG/ML (PF) SYRINGE
PREFILLED_SYRINGE | INTRAVENOUS | Status: DC | PRN
  Administered 2022-11-25: 20 mg via INTRAVENOUS
  Administered 2022-11-25: 70 mg via INTRAVENOUS
  Administered 2022-11-25: 30 mg via INTRAVENOUS
  Administered 2022-11-25: 20 mg via INTRAVENOUS
  Administered 2022-11-25: 10 mg via INTRAVENOUS

## 2022-11-25 MED ORDER — BUPIVACAINE-EPINEPHRINE (PF) 0.5% -1:200000 IJ SOLN
INTRAMUSCULAR | Status: DC | PRN
  Administered 2022-11-25: 30 mL

## 2022-11-25 MED ORDER — ONDANSETRON HCL 4 MG/2ML IJ SOLN
INTRAMUSCULAR | Status: DC | PRN
  Administered 2022-11-25: 4 mg via INTRAVENOUS

## 2022-11-25 MED ORDER — MIDAZOLAM HCL 5 MG/5ML IJ SOLN
INTRAMUSCULAR | Status: DC | PRN
  Administered 2022-11-25: 2 mg via INTRAVENOUS

## 2022-11-25 MED ORDER — LIDOCAINE HCL (PF) 2 % IJ SOLN
INTRAMUSCULAR | Status: AC
  Filled 2022-11-25: qty 5

## 2022-11-25 MED ORDER — TRAMADOL HCL 50 MG PO TABS
50.0000 mg | ORAL_TABLET | Freq: Four times a day (QID) | ORAL | Status: DC | PRN
  Administered 2022-11-26: 50 mg via ORAL
  Filled 2022-11-25: qty 1

## 2022-11-25 MED ORDER — ACETAMINOPHEN 10 MG/ML IV SOLN
1000.0000 mg | Freq: Four times a day (QID) | INTRAVENOUS | Status: DC
  Administered 2022-11-25 – 2022-11-26 (×3): 1000 mg via INTRAVENOUS
  Filled 2022-11-25 (×3): qty 100

## 2022-11-25 MED ORDER — KETOROLAC TROMETHAMINE 15 MG/ML IJ SOLN
INTRAMUSCULAR | Status: AC
  Filled 2022-11-25: qty 1

## 2022-11-25 MED ORDER — HYDROMORPHONE HCL 1 MG/ML IJ SOLN
0.5000 mg | INTRAMUSCULAR | Status: DC | PRN
  Administered 2022-11-25: 1 mg via INTRAVENOUS
  Filled 2022-11-25: qty 1

## 2022-11-25 MED ORDER — BUPIVACAINE HCL (PF) 0.5 % IJ SOLN
INTRAMUSCULAR | Status: AC
  Filled 2022-11-25: qty 30

## 2022-11-25 MED ORDER — SODIUM CHLORIDE 0.45 % IV SOLN: INTRAVENOUS | Status: DC

## 2022-11-25 MED ORDER — ACETAMINOPHEN 500 MG PO TABS: 1000.0000 mg | ORAL_TABLET | Freq: Once | ORAL | Status: DC | PRN

## 2022-11-25 MED ORDER — BUPIVACAINE LIPOSOME 1.3 % IJ SUSP
INTRAMUSCULAR | Status: DC | PRN
  Administered 2022-11-25: 20 mL

## 2022-11-25 MED ORDER — DIPHENHYDRAMINE HCL 50 MG/ML IJ SOLN: 12.5000 mg | Freq: Four times a day (QID) | INTRAMUSCULAR | Status: DC | PRN

## 2022-11-25 MED ORDER — BUPIVACAINE LIPOSOME 1.3 % IJ SUSP
INTRAMUSCULAR | Status: AC
  Filled 2022-11-25: qty 20

## 2022-11-25 MED ORDER — ORAL CARE MOUTH RINSE: 15.0000 mL | Freq: Once | OROMUCOSAL | Status: AC

## 2022-11-25 MED ORDER — DEXAMETHASONE SODIUM PHOSPHATE 10 MG/ML IJ SOLN
INTRAMUSCULAR | Status: AC
  Filled 2022-11-25: qty 1

## 2022-11-25 MED ORDER — ACETAMINOPHEN 10 MG/ML IV SOLN
INTRAVENOUS | Status: AC
  Filled 2022-11-25: qty 100

## 2022-11-25 MED ORDER — ESTRADIOL 0.1 MG/GM VA CREA
TOPICAL_CREAM | VAGINAL | Status: AC
  Filled 2022-11-25: qty 42.5

## 2022-11-25 MED ORDER — HYOSCYAMINE SULFATE 0.125 MG SL SUBL: 0.1250 mg | SUBLINGUAL_TABLET | SUBLINGUAL | Status: DC | PRN

## 2022-11-25 MED ORDER — DEXAMETHASONE SODIUM PHOSPHATE 10 MG/ML IJ SOLN
INTRAMUSCULAR | Status: DC | PRN
  Administered 2022-11-25: 10 mg via INTRAVENOUS

## 2022-11-25 MED ORDER — DIPHENHYDRAMINE HCL 12.5 MG/5ML PO ELIX: 12.5000 mg | ORAL_SOLUTION | Freq: Four times a day (QID) | ORAL | Status: DC | PRN

## 2022-11-25 MED ORDER — PHENYLEPHRINE 80 MCG/ML (10ML) SYRINGE FOR IV PUSH (FOR BLOOD PRESSURE SUPPORT)
PREFILLED_SYRINGE | INTRAVENOUS | Status: DC | PRN
  Administered 2022-11-25 (×2): 160 ug via INTRAVENOUS

## 2022-11-25 MED ORDER — FENTANYL CITRATE (PF) 250 MCG/5ML IJ SOLN
INTRAMUSCULAR | Status: AC
  Filled 2022-11-25: qty 5

## 2022-11-25 MED ORDER — TRAMADOL HCL 50 MG PO TABS: 50.0000 mg | ORAL_TABLET | Freq: Four times a day (QID) | ORAL | 0 refills | Status: AC | PRN

## 2022-11-25 MED ORDER — LACTATED RINGERS IV SOLN: INTRAVENOUS | Status: DC

## 2022-11-25 MED ORDER — KETOROLAC TROMETHAMINE 15 MG/ML IJ SOLN
15.0000 mg | Freq: Four times a day (QID) | INTRAMUSCULAR | Status: DC
  Administered 2022-11-25 – 2022-11-26 (×3): 15 mg via INTRAVENOUS
  Filled 2022-11-25 (×3): qty 1

## 2022-11-25 MED ORDER — DOCUSATE SODIUM 100 MG PO CAPS
100.0000 mg | ORAL_CAPSULE | Freq: Two times a day (BID) | ORAL | Status: DC
  Administered 2022-11-25 – 2022-11-26 (×2): 100 mg via ORAL
  Filled 2022-11-25 (×2): qty 1

## 2022-11-25 SURGICAL SUPPLY — 71 items
BAG COUNTER SPONGE SURGICOUNT (BAG) IMPLANT
BAG LAPAROSCOPIC 12 15 PORT 16 (BASKET) IMPLANT
BAG RETRIEVAL 12/15 (BASKET) ×1
BAG URINE DRAIN 2000ML AR STRL (UROLOGICAL SUPPLIES) IMPLANT
BRIEF MESH DISP LRG (UNDERPADS AND DIAPERS) IMPLANT
CATH FOLEY 2WAY SLVR  5CC 16FR (CATHETERS) ×1
CATH FOLEY 2WAY SLVR 5CC 16FR (CATHETERS) ×1 IMPLANT
CHLORAPREP W/TINT 26 (MISCELLANEOUS) ×1 IMPLANT
CLIP LIGATING HEM O LOK PURPLE (MISCELLANEOUS) ×1 IMPLANT
CLIP LIGATING HEMO LOK XL GOLD (MISCELLANEOUS) IMPLANT
CLIP LIGATING HEMO O LOK GREEN (MISCELLANEOUS) IMPLANT
COVER BACK TABLE 60X90IN (DRAPES) ×1 IMPLANT
COVER SURGICAL LIGHT HANDLE (MISCELLANEOUS) ×1 IMPLANT
COVER TIP SHEARS 8 DVNC (MISCELLANEOUS) ×1 IMPLANT
COVER TIP SHEARS 8MM DA VINCI (MISCELLANEOUS) ×1
DERMABOND ADVANCED .7 DNX12 (GAUZE/BANDAGES/DRESSINGS) ×1 IMPLANT
DRAIN CHANNEL RND F F (WOUND CARE) IMPLANT
DRAPE ARM DVNC X/XI (DISPOSABLE) ×4 IMPLANT
DRAPE COLUMN DVNC XI (DISPOSABLE) ×1 IMPLANT
DRAPE DA VINCI XI ARM (DISPOSABLE) ×4
DRAPE DA VINCI XI COLUMN (DISPOSABLE) ×1
DRAPE INCISE IOBAN 66X45 STRL (DRAPES) ×1 IMPLANT
DRAPE SHEET LG 3/4 BI-LAMINATE (DRAPES) ×2 IMPLANT
DRAPE SURG IRRIG POUCH 19X23 (DRAPES) ×1 IMPLANT
ELECT PENCIL ROCKER SW 15FT (MISCELLANEOUS) ×1 IMPLANT
ELECT REM PT RETURN 15FT ADLT (MISCELLANEOUS) ×1 IMPLANT
GAUZE 4X4 16PLY ~~LOC~~+RFID DBL (SPONGE) IMPLANT
GLOVE BIO SURGEON STRL SZ 6.5 (GLOVE) ×1 IMPLANT
GLOVE SURG LX STRL 7.5 STRW (GLOVE) ×3 IMPLANT
GOWN SRG XL LVL 4 BRTHBL STRL (GOWNS) ×1 IMPLANT
GOWN STRL NON-REIN XL LVL4 (GOWNS) ×1
GOWN STRL REUS W/ TWL XL LVL3 (GOWN DISPOSABLE) ×2 IMPLANT
GOWN STRL REUS W/TWL XL LVL3 (GOWN DISPOSABLE) ×2
HOLDER FOLEY CATH W/STRAP (MISCELLANEOUS) ×1 IMPLANT
IRRIG SUCT STRYKERFLOW 2 WTIP (MISCELLANEOUS) ×1
IRRIGATION SUCT STRKRFLW 2 WTP (MISCELLANEOUS) ×1 IMPLANT
KIT BASIN OR (CUSTOM PROCEDURE TRAY) ×1 IMPLANT
KIT TURNOVER KIT A (KITS) IMPLANT
MANIPULATOR UTERINE 4.5 ZUMI (MISCELLANEOUS) IMPLANT
MARKER SKIN DUAL TIP RULER LAB (MISCELLANEOUS) ×1 IMPLANT
MESH Y UPSYLON VAGINAL (Mesh General) IMPLANT
OCCLUDER COLPOPNEUMO (BALLOONS) IMPLANT
PACKING VAGINAL (PACKING) IMPLANT
PAD OB MATERNITY 4.3X12.25 (PERSONAL CARE ITEMS) IMPLANT
PAD POSITIONING PINK XL (MISCELLANEOUS) ×1 IMPLANT
SEAL CANN UNIV 5-8 DVNC XI (MISCELLANEOUS) ×4 IMPLANT
SEAL XI 5MM-8MM UNIVERSAL (MISCELLANEOUS) ×4
SET IRRIG Y TYPE TUR BLADDER L (SET/KITS/TRAYS/PACK) IMPLANT
SET TUBE SMOKE EVAC HIGH FLOW (TUBING) ×1 IMPLANT
SHEET LAVH (DRAPES) ×1 IMPLANT
SOL PREP POV-IOD 4OZ 10% (MISCELLANEOUS) IMPLANT
SOLUTION ELECTROLUBE (MISCELLANEOUS) ×1 IMPLANT
SURGILUBE 2OZ TUBE FLIPTOP (MISCELLANEOUS) IMPLANT
SUT MNCRL AB 4-0 PS2 18 (SUTURE) ×2 IMPLANT
SUT PROLENE 2 0 CT 1 (SUTURE) ×1 IMPLANT
SUT VIC AB 0 CT1 27 (SUTURE) ×1
SUT VIC AB 0 CT1 27XBRD ANTBC (SUTURE) ×1 IMPLANT
SUT VIC AB 2-0 SH 27 (SUTURE) ×5
SUT VIC AB 2-0 SH 27XBRD (SUTURE) ×5 IMPLANT
SUT VIC AB 3-0 SH 27 (SUTURE) ×2
SUT VIC AB 3-0 SH 27X BRD (SUTURE) IMPLANT
SUT VICRYL 0 UR6 27IN ABS (SUTURE) ×1 IMPLANT
SUT VLOC 180 3-0 9IN GS21 (SUTURE) IMPLANT
SYR 50ML LL SCALE MARK (SYRINGE) IMPLANT
SYS BAG RETRIEVAL 10MM (BASKET)
SYSTEM BAG RETRIEVAL 10MM (BASKET) IMPLANT
TOWEL OR 17X26 10 PK STRL BLUE (TOWEL DISPOSABLE) ×1 IMPLANT
TRAY LAPAROSCOPIC (CUSTOM PROCEDURE TRAY) ×1 IMPLANT
TROCAR Z THREAD OPTICAL 12X100 (TROCAR) ×1 IMPLANT
TROCAR Z-THREAD FIOS 5X100MM (TROCAR) ×1 IMPLANT
WATER STERILE IRR 1000ML POUR (IV SOLUTION) ×1 IMPLANT

## 2022-11-25 NOTE — Op Note (Addendum)
Preoperative diagnosis:   Pelvic organ prolapse  Postoperative diagnosis:   Same Umbilical hernia  Procedure: Robotic-assisted laparoscopic supracervical hysterectomy and  bilateral salpingectomy  Robotic-assisted laparoscopic sacrocolpopexy Umbilical hernia repair  Surgeon: Ardis Hughs, MD First assistant: Debbrah Alar, PA-C  Anesthesia: General  Complications: None  Intraoperative findings:  #1: [place] Y mesh used for the sacrocolpopexy. #2: Large adenomyomatous uterus #3: Bilateral ovarian sparing  EBL: 450 mL  Specimens: Uterus and proximal cervix, bilateral fallopian tubes   Indication: Vanessa Ferguson is a 42 y.o. female patient with symptomatic pelvic organ prolapse.    After reviewing the management options for treatment, she elected to proceed with the above surgical procedure(s). We have discussed the potential benefits and risks of the procedure, side effects of the proposed treatment, the likelihood of the patient achieving the goals of the procedure, and any potential problems that might occur during the procedure or recuperation. Informed consent has been obtained.  Description of procedure:  The patient was taken to the operating room and general anesthesia was induced.  The patient was placed in the dorsal lithotomy position, prepped and draped in the usual sterile fashion, and preoperative antibiotics were administered. A preoperative time-out was performed.    A Foley catheter was then placed and placed to gravity drainage. The cervical os was then dilated w/ serial dilators up to 71F and then inserted the uterine manipulator.  I then made a periumbilical incision carrying the dissection down to the patient's fascia with electrocautery.  Once to the fascia, the fascia was incised and a small puncture hole made in the peritoneum to allow passage of a 48m port.   The abdomen was insufflated and the remaining ports placed under digital  guidance.  2 ports were placed lateral to the umbilicus on the right proximally 10 cm apart.  The most lateral port was approximately 3 cm above the anterior iliac spine.  2 additional ports were placed in the patient's right side in comparable positions to the most lateral port on the right was a 12 mm port.the robot was then docked at an angle from the leg obliquely along the side of the left leg.  We then began our surgery by cleaning up some of the pelvic adhesions to the small bowel and colon.  Once this was completed I started dissecting at the sacral promontory located 3 cm medial to the location where the ureter crosses over the right iliac vessels at the pelvic brim. The posterior peritoneum was incised and the sacral prominence cleared off an area taking care to avoid the middle sacral vessels and the iliac branches.  I then created a posterior peritoneal tunnel starting at the sacral promontory and tunneling down the right pelvic sidewall down into the pelvis breaking back through the posterior peritoneum around the vesico-vaginal junction posteriorly.  I then continued the posterior dissection retracting down on the rectum and finding the avascular plane between the posterior vaginal wall and the rectum.  I carried this dissection down as far as I could to along the area of the perineal body.  Then focused my attention to the uterus and hysterectomy.  I first started by taking the right round ligament with a series of by polar cautery.  I then dissected the anterior leaf of the broad ligament slightly more proximal and then distally down across the anterior mucosa to the salpinx and the internal cervical os.  I then took of the uterine ovarian ligament on the right and  dissected free the right salpinx. Once the anterior leaf of the broad ligament had been completely dissected on the patient's right and a small bladder flap had been created anteriorly attention was turned to the left side where a  similar dissection was carried out. I then turned my attention to the anterior plane between the anterior vaginal wall and the bladder.  I was able to obtain access to the avascular plane and with a combination of both monopolar cautery and blunt dissection was able to get down to the bladder neck.  I then turned my attention back to the patient's uterus and skeletonized the right uterine artery and vein and then took this with a series of bipolar moves.  I then performed a similar uterus pedicle ligation on the left.  At his point I was able to identify the patient's cervix and came through supracervical with monopolar cautery once the uterus was freed from all its attachments it was pushed into the left paracolic gutter and our attention was turned to placing the mesh.  Mesh was measured at approximately 7.5 cm anteriorly and 7.5 cm posteriorly and I cut this on the back table.  The mesh was then placed into the patient's abdomen through the assistant port and the anterior leaf was secured down onto the anterior vaginal wall with the apex at the bladder neck.  The posterior leaf was then secured down on the posterior vaginal wall.  These were sewn down with 2-0 Vicryl.  Between 6 and 8 were done on each side.  At this point I then went back to the previously dissected sacral promontory and posterior peritoneal tunnel and inserted a instrument through the tunnel and grasped the end of the mesh at the vaginal cuff and pull it up to the sacrum.  I then checked to ensure that the sacral mesh was not too tight by performing a vaginal exam.  I then secured the sacral leg of the mesh using a 0 Prolene.  I then reapproximated the posterior peritoneum with a 2-0 Vicryl in a running fashion around the sacral promontory.  The pelvic peritoneum was closed using a pursestring.  A small Endo Catch bag was then gently passed through the assistant port and the uterus was placed in the bag.  The bag was then brought out through  the camera port once the trochars were removed.  We then made a slightly larger extraction incision to remove the uterus.  I extended the incision inferiorly to the umbilicus and incised the hernia sac before reapproximating the fascial layer below the hernia.   The midline fascia was then closed with 0 Vicryl in a figure-of-eight fashion.  The skin was closed with 4-0 Monocryl's.  Dermabond was applied to the incision and exparel injected into the incisions.  Estrace impregnated packing was then placed into her vagina which will be left in overnight.  The patient was subsequently extubated and returned to the PACU in excellent condition.

## 2022-11-25 NOTE — Anesthesia Procedure Notes (Signed)
Procedure Name: Intubation Date/Time: 11/25/2022 7:42 AM  Performed by: Jenne Campus, CRNAPre-anesthesia Checklist: Patient identified, Emergency Drugs available, Suction available and Patient being monitored Patient Re-evaluated:Patient Re-evaluated prior to induction Oxygen Delivery Method: Circle System Utilized Preoxygenation: Pre-oxygenation with 100% oxygen Induction Type: IV induction Ventilation: Mask ventilation without difficulty Laryngoscope Size: Miller and 3 Grade View: Grade I Tube type: Oral Tube size: 7.0 mm Number of attempts: 1 Airway Equipment and Method: Stylet Placement Confirmation: ETT inserted through vocal cords under direct vision, positive ETCO2 and breath sounds checked- equal and bilateral Secured at: 22 cm Tube secured with: Tape Dental Injury: Teeth and Oropharynx as per pre-operative assessment

## 2022-11-25 NOTE — Anesthesia Preprocedure Evaluation (Addendum)
Anesthesia Evaluation  Patient identified by MRN, date of birth, ID band Patient awake    Reviewed: Allergy & Precautions, NPO status , Patient's Chart, lab work & pertinent test results  History of Anesthesia Complications Negative for: history of anesthetic complications  Airway Mallampati: I  TM Distance: >3 FB Neck ROM: Full    Dental  (+) Teeth Intact, Dental Advisory Given   Pulmonary neg pulmonary ROS   breath sounds clear to auscultation       Cardiovascular negative cardio ROS  Rhythm:Regular     Neuro/Psych negative neurological ROS  negative psych ROS   GI/Hepatic negative GI ROS, Neg liver ROS,,,  Endo/Other  R thyroid nodule,used to see Dr Harlow Asa  , s/pFNA (-)  Renal/GU negative Renal ROS    PELVIC ORGAN PROLAPSE    Musculoskeletal negative musculoskeletal ROS (+)    Abdominal   Peds  Hematology negative hematology ROS (+)   Anesthesia Other Findings   Reproductive/Obstetrics Lab Results      Component                Value               Date                      PREGTESTUR               NEGATIVE            11/25/2022                                         Anesthesia Physical Anesthesia Plan  ASA: 1  Anesthesia Plan: General   Post-op Pain Management: Minimal or no pain anticipated   Induction: Intravenous  PONV Risk Score and Plan: 3 and Ondansetron, Dexamethasone and Midazolam  Airway Management Planned: Oral ETT  Additional Equipment: None  Intra-op Plan:   Post-operative Plan: Extubation in OR  Informed Consent: I have reviewed the patients History and Physical, chart, labs and discussed the procedure including the risks, benefits and alternatives for the proposed anesthesia with the patient or authorized representative who has indicated his/her understanding and acceptance.     Dental advisory given  Plan Discussed with: CRNA  Anesthesia Plan  Comments:         Anesthesia Quick Evaluation

## 2022-11-25 NOTE — Transfer of Care (Signed)
Immediate Anesthesia Transfer of Care Note  Patient: Vanessa Ferguson  Procedure(s) Performed: Procedure(s) with comments: XI ROBOTIC ASSISTED LAPAROSCOPIC HYSTERECTOMY AND SALPINGECTOMY (N/A) - 240 MINUTES NEEDED FOR CASE XI ROBOTIC ASSISTED LAPAROSCOPIC SACROCOLPOPEXY (N/A)  Patient Location: PACU  Anesthesia Type:General  Level of Consciousness: Patient easily awoken, sedated, comfortable, cooperative, following commands, responds to stimulation.   Airway & Oxygen Therapy: Patient spontaneously breathing, ventilating well, oxygen via simple oxygen mask.  Post-op Assessment: Report given to PACU RN, vital signs reviewed and stable, moving all extremities.   Post vital signs: Reviewed and stable.  Complications: No apparent anesthesia complications  Last Vitals:  Vitals Value Taken Time  BP 110/77 11/25/22 1153  Temp    Pulse 85 11/25/22 1155  Resp 22 11/25/22 1155  SpO2 100 % 11/25/22 1155  Vitals shown include unvalidated device data.  Last Pain:  Vitals:   11/25/22 0611  TempSrc:   PainSc: 0-No pain      Patients Stated Pain Goal: 3 (96/88/64 8472)  Complications: No notable events documented.

## 2022-11-25 NOTE — Discharge Instructions (Addendum)

## 2022-11-25 NOTE — H&P (Signed)
42 year old female with a family history of pelvic organ prolapse presents today for evaluation of a cystocele.   The patient has had prolapse for the last 4 years. She states that this occurred after her daughter was born. She has a lot of pain associated with the prolapse and is eager to get it fixed. She did undergo an A/P repair by her gynecologist about 1 month ago. 3 weeks following the operation she noted recurrence or failure. She is having significant discomfort. She has issues emptying her bladder with the prolapse. She did have some incontinence prior to her prolapse repair, but at this point I does not have any voiding issues. She denies any frequency or urgency. She does mostly complaining of pain.   The patient maintains her uterus, she still has periods.   The patient's mother is a patient of mine, we did her prolapse repair about 6 months ago.   Interval: Patient is here today for preop evaluation and further discussion of her planned sacrocolpopexy. The patient relates that she still has her uterus, and is still having menstrual periods. She is interested in preserving her ovaries.   The patient has no real significant past medical history. She is quite healthy.     ALLERGIES: No Allergies    MEDICATIONS: Multivitamin  Vitamin D     GU PSH: No GU PSH    NON-GU PSH: No Non-GU PSH    GU PMH: Female genital prolapse, unspecified - 09/08/2022, - 2020    NON-GU PMH: No Non-GU PMH    FAMILY HISTORY: 1 Daughter - Daughter 1 son - Son Diabetes - Father   SOCIAL HISTORY: Marital Status: Married Preferred Language: English Current Smoking Status: Patient has never smoked.   Tobacco Use Assessment Completed: Used Tobacco in last 30 days? Has never drank.  Drinks 2 caffeinated drinks per day. Patient's occupation Scientist, research (life sciences).    REVIEW OF SYSTEMS:    GU Review Female:   Patient denies frequent urination, hard to postpone urination, burning /pain with  urination, get up at night to urinate, leakage of urine, stream starts and stops, trouble starting your stream, have to strain to urinate, and being pregnant.  Gastrointestinal (Upper):   Patient denies nausea, vomiting, and indigestion/ heartburn.  Gastrointestinal (Lower):   Patient denies diarrhea and constipation.  Constitutional:   Patient denies fever, night sweats, weight loss, and fatigue.  Skin:   Patient denies skin rash/ lesion and itching.  Eyes:   Patient denies blurred vision and double vision.  Ears/ Nose/ Throat:   Patient denies sore throat and sinus problems.  Hematologic/Lymphatic:   Patient denies swollen glands and easy bruising.  Cardiovascular:   Patient denies leg swelling and chest pains.  Respiratory:   Patient denies cough and shortness of breath.  Endocrine:   Patient denies excessive thirst.  Musculoskeletal:   Patient denies back pain and joint pain.  Neurological:   Patient denies headaches and dizziness.  Psychologic:   Patient denies anxiety and depression.   VITAL SIGNS: None   MULTI-SYSTEM PHYSICAL EXAMINATION:    Constitutional: Well-nourished. No physical deformities. Normally developed. Good grooming.  Neck: Neck symmetrical, not swollen. Normal tracheal position.  Respiratory: Normal breath sounds. No labored breathing, no use of accessory muscles.   Cardiovascular: Regular rate and rhythm. No murmur, no gallop. Normal temperature, normal extremity pulses, no swelling, no varicosities.   Lymphatic: No enlargement of neck, axillae, groin.  Skin: No paleness, no jaundice, no cyanosis. No lesion, no ulcer,  no rash.  Neurologic / Psychiatric: Oriented to time, oriented to place, oriented to person. No depression, no anxiety, no agitation.  Gastrointestinal: No mass, no tenderness, no rigidity, non obese abdomen.  Eyes: Normal conjunctivae. Normal eyelids.  Ears, Nose, Mouth, and Throat: Left ear no scars, no lesions, no masses. Right ear no scars, no  lesions, no masses. Nose no scars, no lesions, no masses. Normal hearing. Normal lips.  Musculoskeletal: Normal gait and station of head and neck.     Complexity of Data:  Source Of History:  Patient  Records Review:   Previous Doctor Records, Previous Patient Records, POC Tool  Urine Test Review:   Urinalysis   PROCEDURES:          Urinalysis w/Scope Dipstick Dipstick Cont'd Micro  Color: Yellow Bilirubin: Neg mg/dL WBC/hpf: NS (Not Seen)  Appearance: Slightly Cloudy Ketones: Neg mg/dL RBC/hpf: 0 - 2/hpf  Specific Gravity: 1.025 Blood: 1+ ery/uL Bacteria: Mod (26-50/hpf)  pH: 5.5 Protein: Trace mg/dL Cystals: NS (Not Seen)  Glucose: Neg mg/dL Urobilinogen: 0.2 mg/dL Casts: NS (Not Seen)    Nitrites: Neg Trichomonas: Not Present    Leukocyte Esterase: Neg leu/uL Mucous: Present      Epithelial Cells: 10 - 20/hpf      Yeast: NS (Not Seen)      Sperm: Not Present    ASSESSMENT:      ICD-10 Details  1 GU:   Female genital prolapse, unspecified - N81.9    PLAN:           Document Letter(s):  Created for Patient: Clinical Summary         Notes:   I went over robotic-assisted laparoscopic sacral colpopexy with the patient detail. I explained to the patient the rationale for the surgery. I also went over the placement of the laparoscopic ports. I detailed to her the surgery as well as the postoperative recovery time. I explained to the patient that she could expect to be in the hospital at least one or 2 nights. She will require 4 weeks of no heavy lifting, 6 weeks of no bending or twisting. She will not be able to use her vagina for 6 weeks. I discussed complications of the operation including injury to bowel, ureters, bladder. We also discussed the risk of failure as well as the complications of mesh. I explained to them the difference between transvaginal mesh and the mesh used for sacral colpopexy. I reassured them that there has not been an FDA warnings in regards to sacral colpopexy  mesh. We will plan to get this prior to her surgery. I spent 45 minutes with the patient going over that ins and outs of the surgery and answering all her questions.   We will plan to perform an ovarian sparing surgery. She is currently scheduled for surgery next month.

## 2022-11-26 ENCOUNTER — Encounter (HOSPITAL_COMMUNITY): Payer: Self-pay | Admitting: Urology

## 2022-11-26 DIAGNOSIS — N819 Female genital prolapse, unspecified: Secondary | ICD-10-CM | POA: Diagnosis not present

## 2022-11-26 LAB — HEMOGLOBIN AND HEMATOCRIT, BLOOD
HCT: 32.7 % — ABNORMAL LOW (ref 36.0–46.0)
Hemoglobin: 11.1 g/dL — ABNORMAL LOW (ref 12.0–15.0)

## 2022-11-26 LAB — BASIC METABOLIC PANEL
Anion gap: 7 (ref 5–15)
BUN: 12 mg/dL (ref 6–20)
CO2: 22 mmol/L (ref 22–32)
Calcium: 8.1 mg/dL — ABNORMAL LOW (ref 8.9–10.3)
Chloride: 105 mmol/L (ref 98–111)
Creatinine, Ser: 0.64 mg/dL (ref 0.44–1.00)
GFR, Estimated: >60 mL/min (ref >60)
Glucose, Bld: 104 mg/dL — ABNORMAL HIGH (ref 70–99)
Potassium: 3.6 mmol/L (ref 3.5–5.1)
Sodium: 134 mmol/L — ABNORMAL LOW (ref 135–145)

## 2022-11-26 LAB — SURGICAL PATHOLOGY

## 2022-11-26 NOTE — Discharge Summary (Signed)
Date of admission: 11/25/2022  Date of discharge: 11/26/2022  Admission diagnosis: pelvic organ prolapse  Discharge diagnosis: same  Secondary diagnoses:  Patient Active Problem List   Diagnosis Date Noted   Cystocele with prolapse 11/25/2022   Presence of pessary 02/25/2022   Midline cystocele 01/15/2021   Disorder of thyroid gland 01/15/2021   Atypical squamous cells of undetermined significance (ASCUS) on Papanicolaou smear of cervix 01/15/2021   Postpartum care following vaginal delivery (9/18) 07/21/2018   Encounter for planned induction of labor 07/20/2018   Fetal abnormality affecting management of mother, antepartum 03/15/2018   PCP NOTES >>>>>>>>>>>>>>>>>>>>>>> 07/21/2016   Annual physical exam 10/17/2013   THYROID NODULE 07/19/2009    Procedures performed: Procedure(s): XI ROBOTIC ASSISTED LAPAROSCOPIC HYSTERECTOMY AND SALPINGECTOMY XI ROBOTIC ASSISTED LAPAROSCOPIC SACROCOLPOPEXY  History and Physical: For full details, please see admission history and physical. Briefly, Vanessa Ferguson is a 42 y.o. year old patient with symptomatic prolapse.   Hospital Course: Patient tolerated the procedure well.  She was then transferred to the floor after an uneventful PACU stay.  Her hospital course was uncomplicated.  On POD#1 she had met discharge criteria: was eating a regular diet, was up and ambulating independently,  pain was well controlled, was voiding without a catheter, and was ready to for discharge.   PE on day of discharge: NAD Vitals:   11/25/22 1923 11/25/22 2042 11/26/22 0139 11/26/22 0514  BP: 1'03/67 97/60 90/61 '$ (!) 96/58  Pulse: 90 81 75 79  Resp: '18 18 18 18  '$ Temp: 99.1 F (37.3 C) 99.8 F (37.7 C) 98.6 F (37 C) 98.4 F (36.9 C)  TempSrc: Oral Oral Oral Oral  SpO2: 97% 96% 96% 98%  Weight:      Height:        Intake/Output Summary (Last 24 hours) at 11/26/2022 0742 Last data filed at 11/26/2022 0600 Gross per 24 hour  Intake 3622.42 ml  Output 3040  ml  Net 582.42 ml  Non-labored breathing Incision is c/d/I Foley is out Extremities are symmetric  Laboratory values:  Recent Labs    11/25/22 1218 11/26/22 0426  HGB 13.2 11.1*  HCT 39.5 32.7*   Recent Labs    11/26/22 0426  NA 134*  K 3.6  CL 105  CO2 22  GLUCOSE 104*  BUN 12  CREATININE 0.64  CALCIUM 8.1*   No results for input(s): "LABPT", "INR" in the last 72 hours. No results for input(s): "LABURIN" in the last 72 hours. Results for orders placed or performed during the hospital encounter of 11/17/22  Urine Culture     Status: Abnormal   Collection Time: 11/17/22  8:06 AM   Specimen: Urine, Clean Catch  Result Value Ref Range Status   Specimen Description   Final    URINE, CLEAN CATCH Performed at [facility], [place] [address], [place], [place] [address]    Special Requests   Final    NONE Performed at [facility], [place] [address] [place], [place] [address]    Culture MULTIPLE SPECIES PRESENT, SUGGEST RECOLLECTION (A)  Final   Report Status 11/18/2022 FINAL  Final    Disposition: Home  Discharge instruction: The patient was instructed to be ambulatory but told to refrain from heavy lifting, strenuous activity, or driving.   Discharge medications:  Allergies as of 11/26/2022   Not on File      Medication List     STOP taking these medications    multivitamin with minerals Tabs tablet  VITAMIN B-12 PO   Vitamin D (Ergocalciferol) 1.25 MG (50000 UNIT) Caps capsule Commonly known as: DRISDOL       TAKE these medications    docusate sodium 100 MG capsule Commonly known as: COLACE Take 1 capsule (100 mg total) by mouth 2 (two) times daily.   sulfamethoxazole-trimethoprim 800-160 MG tablet Commonly known as: BACTRIM DS Take 1 tablet by mouth 2 (two) times daily.   traMADol 50 MG tablet Commonly known as: Ultram Take 1-2 tablets (50-100 mg total) by mouth every 6 (six) hours as needed for moderate  pain or severe pain. What changed:  how much to take reasons to take this        Followup:   Follow-up Information     Hollace Hayward, NP Follow up on 12/08/2022.   Why: at 9:15 Contact information: [place]. [place] [address] [phone]

## 2022-11-26 NOTE — Anesthesia Postprocedure Evaluation (Signed)
Anesthesia Post Note  Patient: Vanessa Ferguson  Procedure(s) Performed: XI ROBOTIC ASSISTED LAPAROSCOPIC HYSTERECTOMY AND SALPINGECTOMY XI ROBOTIC ASSISTED LAPAROSCOPIC SACROCOLPOPEXY     Patient location during evaluation: PACU Anesthesia Type: General Level of consciousness: awake and alert Pain management: pain level controlled Vital Signs Assessment: post-procedure vital signs reviewed and stable Respiratory status: spontaneous breathing, nonlabored ventilation and respiratory function stable Cardiovascular status: blood pressure returned to baseline and stable Postop Assessment: no apparent nausea or vomiting Anesthetic complications: no   No notable events documented.  Last Vitals:  Vitals:   11/25/22 1923 11/25/22 2042  BP: 103/67 97/60  Pulse: 90 81  Resp: 18 18  Temp: 37.3 C 37.7 C  SpO2: 97% 96%    Last Pain:  Vitals:   11/25/22 2235  TempSrc:   PainSc: Asleep                 Marvin Maenza

## 2022-11-26 NOTE — Plan of Care (Signed)
Patient is stable for discharge. Discharge instructions have been given. All questions answered, patient is discharged to home with spouse.  

## 2023-01-06 ENCOUNTER — Encounter: Payer: Self-pay | Admitting: Internal Medicine
